# Patient Record
Sex: Male | Born: 1945 | Race: White | Hispanic: No | Marital: Married | Smoking: Former smoker
Health system: Southern US, Community
[De-identification: ages and names within clinical notes are randomized; demographics above are authoritative.]

## PROBLEM LIST (undated history)

## (undated) DIAGNOSIS — R413 Other amnesia: Secondary | ICD-10-CM

## (undated) DIAGNOSIS — M1990 Unspecified osteoarthritis, unspecified site: Secondary | ICD-10-CM

## (undated) DIAGNOSIS — E7800 Pure hypercholesterolemia, unspecified: Secondary | ICD-10-CM

## (undated) DIAGNOSIS — I10 Essential (primary) hypertension: Secondary | ICD-10-CM

## (undated) HISTORY — PX: BACK SURGERY: SHX140

## (undated) HISTORY — DX: Other amnesia: R41.3

---

## 2001-02-11 ENCOUNTER — Other Ambulatory Visit: Admission: RE | Admit: 2001-02-11 | Discharge: 2001-02-11 | Payer: Self-pay | Admitting: Urology

## 2001-02-27 ENCOUNTER — Other Ambulatory Visit: Admission: RE | Admit: 2001-02-27 | Discharge: 2001-02-27 | Payer: Self-pay | Admitting: Urology

## 2001-04-11 ENCOUNTER — Ambulatory Visit (HOSPITAL_COMMUNITY): Admission: RE | Admit: 2001-04-11 | Discharge: 2001-04-11 | Payer: Self-pay | Admitting: Internal Medicine

## 2001-09-17 ENCOUNTER — Ambulatory Visit (HOSPITAL_COMMUNITY): Admission: RE | Admit: 2001-09-17 | Discharge: 2001-09-17 | Payer: Self-pay | Admitting: Internal Medicine

## 2001-09-17 ENCOUNTER — Encounter: Payer: Self-pay | Admitting: Internal Medicine

## 2001-10-04 ENCOUNTER — Inpatient Hospital Stay (HOSPITAL_COMMUNITY): Admission: RE | Admit: 2001-10-04 | Discharge: 2001-10-05 | Payer: Self-pay | Admitting: Neurosurgery

## 2006-03-07 ENCOUNTER — Ambulatory Visit (HOSPITAL_COMMUNITY): Admission: RE | Admit: 2006-03-07 | Discharge: 2006-03-07 | Payer: Self-pay | Admitting: Internal Medicine

## 2012-07-05 ENCOUNTER — Encounter (HOSPITAL_COMMUNITY): Payer: Self-pay | Admitting: Emergency Medicine

## 2012-07-05 ENCOUNTER — Emergency Department (HOSPITAL_COMMUNITY)
Admission: EM | Admit: 2012-07-05 | Discharge: 2012-07-05 | Disposition: A | Discharge status: Home / Self Care | Payer: BC Managed Care – PPO | Attending: Emergency Medicine | Admitting: Emergency Medicine

## 2012-07-05 ENCOUNTER — Emergency Department (HOSPITAL_COMMUNITY): Payer: BC Managed Care – PPO

## 2012-07-05 DIAGNOSIS — E7800 Pure hypercholesterolemia, unspecified: Secondary | ICD-10-CM | POA: Insufficient documentation

## 2012-07-05 DIAGNOSIS — S82409A Unspecified fracture of shaft of unspecified fibula, initial encounter for closed fracture: Secondary | ICD-10-CM | POA: Insufficient documentation

## 2012-07-05 DIAGNOSIS — I10 Essential (primary) hypertension: Secondary | ICD-10-CM | POA: Insufficient documentation

## 2012-07-05 DIAGNOSIS — W1789XA Other fall from one level to another, initial encounter: Secondary | ICD-10-CM | POA: Insufficient documentation

## 2012-07-05 HISTORY — DX: Essential (primary) hypertension: I10

## 2012-07-05 HISTORY — DX: Pure hypercholesterolemia, unspecified: E78.00

## 2012-07-05 MED ORDER — MORPHINE SULFATE 4 MG/ML IJ SOLN
4.0000 mg | Freq: Once | INTRAMUSCULAR | Status: AC
Start: 2012-07-05 — End: 2012-07-05

## 2012-07-05 MED ORDER — MORPHINE BOLUS VIA INFUSION: 4.0000 mg | Freq: Once | INTRAVENOUS | Status: DC

## 2012-07-05 MED ORDER — MORPHINE SULFATE 4 MG/ML IJ SOLN
4.0000 mg | Freq: Once | INTRAMUSCULAR | Status: AC
  Administered 2012-07-05: 4 mg via INTRAVENOUS

## 2012-07-05 MED ORDER — MORPHINE SULFATE 4 MG/ML IJ SOLN
INTRAMUSCULAR | Status: AC
  Filled 2012-07-05: qty 1

## 2012-07-05 NOTE — ED Notes (Signed)
Dr Judd Lien and Dr Ouida Sills at bedside discussing plan of care with pt after test results completed.

## 2012-07-05 NOTE — ED Notes (Signed)
Dr. Hewitt(covering Dr. Leonides Grills) paged by his answering service for Dr. Ouida Sills.

## 2012-07-05 NOTE — ED Provider Notes (Signed)
History     CSN: [id number]  Arrival date & time 07/05/12  1750   First MD Initiated Contact with Patient 07/05/12 1802      Chief Complaint  Patient presents with  . Ankle Pain    (Consider location/radiation/quality/duration/timing/severity/associated sxs/prior treatment) HPI Comments: Patient was putting tree stand up when he lost his balance and fell about 15-18 feet, landing on both feet.  He is complaining of pain in both ankles, the right much greater than the left.  He denies other injury.  No back pain.  No loc.  No abd pain.  Patient is a 66 y.o. male presenting with ankle pain. The history is provided by the patient.  Ankle Pain  The incident occurred less than 1 hour ago. Incident location: [place] woods. The injury mechanism was a fall. The pain is present in the left ankle and right ankle. The quality of the pain is described as sharp and throbbing. The pain is moderate. The pain has been constant since onset. Associated symptoms include inability to bear weight. Pertinent negatives include no numbness.    Past Medical History  Diagnosis Date  . Hypertension   . High cholesterol     Past Surgical History  Procedure Date  . Back surgery     No family history on file.  History  Substance Use Topics  . Smoking status: Former Games developer  . Smokeless tobacco: Not on file  . Alcohol Use: Yes     social      Review of Systems  Neurological: Negative for numbness.  All other systems reviewed and are negative.    Allergies  Review of patient's allergies indicates no known allergies.  Home Medications  No current outpatient prescriptions on file.  BP 146/65  Pulse 62  Temp 98.3 F (36.8 C)  Resp 18  Ht 5\' 8"  (1.727 m)  Wt 180 lb (81.647 kg)  BMI 27.37 kg/m2  SpO2 99%  Physical Exam  Nursing note and vitals reviewed. Constitutional: He is oriented to person, place, and time. He appears well-developed and well-nourished. No distress.  HENT:  Head:  Normocephalic and atraumatic.  Neck: Normal range of motion. Neck supple.  Musculoskeletal: Normal range of motion.       There is no lumbar spine ttp.  There is swelling of the right ankle with ttp over the lateral malleolus.  The dorsalis pedis pulses are intact as is sensation and motor.  The left ankle has mild swelling over the lateral malleolus but no deformity.  Distal pulses, motor, and sensation are intact.  Neurological: He is alert and oriented to person, place, and time. No cranial nerve deficit.  Skin: Skin is warm and dry. He is not diaphoretic.    ED Course  Procedures (including critical care time)  Labs Reviewed - No data to display Dg Ankle Complete Left  07/05/2012  *RADIOLOGY REPORT*  Clinical Data: Fall from tree stand, bilateral ankle pain  LEFT ANKLE COMPLETE - 3+ VIEW  Comparison: None.  Findings: Possible tiny avulsion fracture along the inferior aspect of the medial malleolus.  The ankle mortise is intact.  The base of the fifth metatarsal is unremarkable.  Mild medial soft tissue swelling.  IMPRESSION: Possible tiny avulsion fracture along the inferior aspect of the medial malleolus.   Original Report Authenticated By: Charline Bills, M.D.    Dg Ankle Complete Right  07/05/2012  *RADIOLOGY REPORT*  Clinical Data: Fall from tree, bilateral ankle pain/swelling  RIGHT ANKLE - COMPLETE  3+ VIEW  Comparison: None.  Findings: Comminuted distal fibular fracture, mildly displaced.  Possible tiny avulsion fracture along the inferior aspect of the medial malleolus.  Mild widening of the medial ankle mortise.  The base of the fifth metatarsal is unremarkable.  Moderate soft tissue swelling.  IMPRESSION: Comminuted distal fibular fracture, mildly displaced.  Possible tiny avulsion fracture along the medial malleolus.   Original Report Authenticated By: Charline Bills, M.D.      No diagnosis found.    MDM  The xrays reveal a distal fibular fracture of the right ankle with  widening of the mortoise on the medial aspect.  The left ankle was read by radiology as a possible tiny avulsion fracture to the lateral malleolous.  The patient is a personal friend of Dr. Ouida Sills who was present in the ER.  He took the liberty of speaking with the orthopedist on call and arrangements have been made for splinting, follow up on Monday with Orthopedics.  He has also written him a prescription for pain medications.         Geoffery Lyons, MD 07/05/12 [id number]

## 2012-07-05 NOTE — ED Notes (Signed)
Pt fell approximately 18 ft from tree stand onto feet/back. Denies loc. Denies neck/back pain. Dr.Fagan at bedside.

## 2012-07-05 NOTE — ED Notes (Signed)
Pt presents with Dr Ouida Sills at bedside after a substantial fall from a tree stand. Rt ankle swollen with deformity noted. Pt denies LOC, Neck/back and head injury during fall. Pt is alert, oriented x 4. No distress noted.

## 2012-07-05 NOTE — ED Notes (Signed)
Pt assisted to wheelchair and in the car by Gaspar Garbe, NT and Tammy Sours, RN

## 2012-07-08 ENCOUNTER — Encounter (HOSPITAL_BASED_OUTPATIENT_CLINIC_OR_DEPARTMENT_OTHER): Payer: Self-pay | Admitting: *Deleted

## 2012-07-08 NOTE — Progress Notes (Signed)
Pt fell from deer stand-fx rt ankle-severly sprained lt ankle-cannot walk-using w/c-wife states it is very hard to get him out-she has to call brother to help her. Will do istat and elk on arrival

## 2012-07-11 ENCOUNTER — Ambulatory Visit (HOSPITAL_BASED_OUTPATIENT_CLINIC_OR_DEPARTMENT_OTHER): Payer: BC Managed Care – PPO | Admitting: Anesthesiology

## 2012-07-11 ENCOUNTER — Encounter (HOSPITAL_BASED_OUTPATIENT_CLINIC_OR_DEPARTMENT_OTHER): Payer: Self-pay | Admitting: Anesthesiology

## 2012-07-11 ENCOUNTER — Ambulatory Visit (HOSPITAL_COMMUNITY): Payer: BC Managed Care – PPO

## 2012-07-11 ENCOUNTER — Encounter (HOSPITAL_BASED_OUTPATIENT_CLINIC_OR_DEPARTMENT_OTHER): Payer: Self-pay

## 2012-07-11 ENCOUNTER — Encounter (HOSPITAL_BASED_OUTPATIENT_CLINIC_OR_DEPARTMENT_OTHER)
Admission: RE | Disposition: A | Discharge status: Home / Self Care | Payer: Self-pay | Source: Ambulatory Visit | Attending: Orthopedic Surgery

## 2012-07-11 ENCOUNTER — Ambulatory Visit (HOSPITAL_BASED_OUTPATIENT_CLINIC_OR_DEPARTMENT_OTHER)
Admission: RE | Admit: 2012-07-11 | Discharge: 2012-07-11 | Disposition: A | Discharge status: Home / Self Care | Payer: BC Managed Care – PPO | Source: Ambulatory Visit | Attending: Orthopedic Surgery | Admitting: Orthopedic Surgery

## 2012-07-11 DIAGNOSIS — S8263XA Displaced fracture of lateral malleolus of unspecified fibula, initial encounter for closed fracture: Secondary | ICD-10-CM | POA: Insufficient documentation

## 2012-07-11 DIAGNOSIS — W1789XA Other fall from one level to another, initial encounter: Secondary | ICD-10-CM | POA: Insufficient documentation

## 2012-07-11 DIAGNOSIS — Y92009 Unspecified place in unspecified non-institutional (private) residence as the place of occurrence of the external cause: Secondary | ICD-10-CM | POA: Insufficient documentation

## 2012-07-11 DIAGNOSIS — S93439A Sprain of tibiofibular ligament of unspecified ankle, initial encounter: Secondary | ICD-10-CM | POA: Insufficient documentation

## 2012-07-11 DIAGNOSIS — I10 Essential (primary) hypertension: Secondary | ICD-10-CM | POA: Insufficient documentation

## 2012-07-11 HISTORY — PX: ORIF ANKLE FRACTURE: SHX5408

## 2012-07-11 HISTORY — DX: Unspecified osteoarthritis, unspecified site: M19.90

## 2012-07-11 LAB — POCT I-STAT, CHEM 8
Calcium, Ion: 1.19 mmol/L (ref 1.13–1.30)
Glucose, Bld: 106 mg/dL — ABNORMAL HIGH (ref 70–99)
HCT: 40 % (ref 39.0–52.0)
Hemoglobin: 13.6 g/dL (ref 13.0–17.0)
TCO2: 24 mmol/L (ref 0–100)

## 2012-07-11 SURGERY — OPEN REDUCTION INTERNAL FIXATION (ORIF) ANKLE FRACTURE
Anesthesia: Regional | Site: Ankle | Laterality: Right | Wound class: Clean

## 2012-07-11 MED ORDER — BACITRACIN ZINC 500 UNIT/GM EX OINT
TOPICAL_OINTMENT | CUTANEOUS | Status: DC | PRN
  Administered 2012-07-11: 1 via TOPICAL

## 2012-07-11 MED ORDER — BUPIVACAINE-EPINEPHRINE PF 0.5-1:200000 % IJ SOLN
INTRAMUSCULAR | Status: DC | PRN
  Administered 2012-07-11: 30 mL

## 2012-07-11 MED ORDER — CHLORHEXIDINE GLUCONATE 4 % EX LIQD: 60.0000 mL | Freq: Once | CUTANEOUS | Status: DC

## 2012-07-11 MED ORDER — CEFAZOLIN SODIUM-DEXTROSE 2-3 GM-% IV SOLR
2.0000 g | INTRAVENOUS | Status: AC
  Administered 2012-07-11: 2 g via INTRAVENOUS

## 2012-07-11 MED ORDER — FENTANYL CITRATE 0.05 MG/ML IJ SOLN
50.0000 ug | INTRAMUSCULAR | Status: DC | PRN
  Administered 2012-07-11: 100 ug via INTRAVENOUS

## 2012-07-11 MED ORDER — ONDANSETRON HCL 4 MG/2ML IJ SOLN: 4.0000 mg | Freq: Four times a day (QID) | INTRAMUSCULAR | Status: DC | PRN

## 2012-07-11 MED ORDER — CEFAZOLIN SODIUM-DEXTROSE 2-3 GM-% IV SOLR: 2.0000 g | INTRAVENOUS | Status: DC

## 2012-07-11 MED ORDER — HYDROMORPHONE HCL PF 1 MG/ML IJ SOLN: 0.2500 mg | INTRAMUSCULAR | Status: DC | PRN

## 2012-07-11 MED ORDER — MIDAZOLAM HCL 5 MG/5ML IJ SOLN
INTRAMUSCULAR | Status: DC | PRN
  Administered 2012-07-11: 1 mg via INTRAVENOUS

## 2012-07-11 MED ORDER — OXYCODONE HCL 5 MG PO TABS: 5.0000 mg | ORAL_TABLET | ORAL | Status: DC | PRN

## 2012-07-11 MED ORDER — ONDANSETRON HCL 4 MG/2ML IJ SOLN
INTRAMUSCULAR | Status: DC | PRN
  Administered 2012-07-11: 4 mg via INTRAVENOUS

## 2012-07-11 MED ORDER — 0.9 % SODIUM CHLORIDE (POUR BTL) OPTIME
TOPICAL | Status: DC | PRN
  Administered 2012-07-11: 300 mL

## 2012-07-11 MED ORDER — DEXAMETHASONE SODIUM PHOSPHATE 10 MG/ML IJ SOLN
INTRAMUSCULAR | Status: DC | PRN
  Administered 2012-07-11: 10 mg via INTRAVENOUS

## 2012-07-11 MED ORDER — PROPOFOL 10 MG/ML IV BOLUS
INTRAVENOUS | Status: DC | PRN
  Administered 2012-07-11: 200 mg via INTRAVENOUS

## 2012-07-11 MED ORDER — LACTATED RINGERS IV SOLN
INTRAVENOUS | Status: DC
  Administered 2012-07-11 (×2): via INTRAVENOUS

## 2012-07-11 MED ORDER — LIDOCAINE HCL (CARDIAC) 20 MG/ML IV SOLN
INTRAVENOUS | Status: DC | PRN
  Administered 2012-07-11: 30 mg via INTRAVENOUS

## 2012-07-11 MED ORDER — MIDAZOLAM HCL 2 MG/2ML IJ SOLN
1.0000 mg | INTRAMUSCULAR | Status: DC | PRN
  Administered 2012-07-11: 2 mg via INTRAVENOUS

## 2012-07-11 MED ORDER — SODIUM CHLORIDE 0.9 % IV SOLN: INTRAVENOUS | Status: DC

## 2012-07-11 SURGICAL SUPPLY — 74 items
BAG DECANTER FOR FLEXI CONT (MISCELLANEOUS) IMPLANT
BANDAGE ESMARK 6X9 LF (GAUZE/BANDAGES/DRESSINGS) ×1 IMPLANT
BIT DRILL 2.5X2.75 QC CALB (BIT) ×1 IMPLANT
BIT DRILL 3.5X5.5 QC CALB (BIT) ×1 IMPLANT
BIT DRILL CALIBRATED 2.7 (BIT) ×1 IMPLANT
BLADE SURG 15 STRL LF DISP TIS (BLADE) ×3 IMPLANT
BLADE SURG 15 STRL SS (BLADE) ×4
BNDG CMPR 9X6 STRL LF SNTH (GAUZE/BANDAGES/DRESSINGS) ×1
BNDG COHESIVE 4X5 TAN STRL (GAUZE/BANDAGES/DRESSINGS) ×3 IMPLANT
BNDG COHESIVE 6X5 TAN STRL LF (GAUZE/BANDAGES/DRESSINGS) ×3 IMPLANT
BNDG ESMARK 6X9 LF (GAUZE/BANDAGES/DRESSINGS) ×2
CHLORAPREP W/TINT 26ML (MISCELLANEOUS) ×2 IMPLANT
CLOTH BEACON ORANGE TIMEOUT ST (SAFETY) ×2 IMPLANT
COVER MAYO STAND STRL (DRAPES) ×1 IMPLANT
COVER TABLE BACK 60X90 (DRAPES) ×2 IMPLANT
CUFF TOURNIQUET SINGLE 34IN LL (TOURNIQUET CUFF) ×2 IMPLANT
DECANTER SPIKE VIAL GLASS SM (MISCELLANEOUS) IMPLANT
DRAPE C-ARM 42X72 X-RAY (DRAPES) ×2 IMPLANT
DRAPE EXTREMITY T 121X128X90 (DRAPE) ×2 IMPLANT
DRAPE INCISE IOBAN 66X45 STRL (DRAPES) ×2 IMPLANT
DRAPE U-SHAPE 47X51 STRL (DRAPES) ×2 IMPLANT
DRAPE U-SHAPE 76X120 STRL (DRAPES) ×2 IMPLANT
DRESSING ADAPTIC 1/2  N-ADH (PACKING) IMPLANT
DRSG EMULSION OIL 3X3 NADH (GAUZE/BANDAGES/DRESSINGS) ×4 IMPLANT
DRSG PAD ABDOMINAL 8X10 ST (GAUZE/BANDAGES/DRESSINGS) ×4 IMPLANT
ELECT REM PT RETURN 9FT ADLT (ELECTROSURGICAL) ×2
ELECTRODE REM PT RTRN 9FT ADLT (ELECTROSURGICAL) ×1 IMPLANT
GLOVE BIO SURGEON STRL SZ8 (GLOVE) ×3 IMPLANT
GLOVE BIOGEL PI IND STRL 8 (GLOVE) ×1 IMPLANT
GLOVE BIOGEL PI INDICATOR 8 (GLOVE) ×1
GLOVE ECLIPSE 6.5 STRL STRAW (GLOVE) ×1 IMPLANT
GOWN PREVENTION PLUS XLARGE (GOWN DISPOSABLE) ×2 IMPLANT
GOWN PREVENTION PLUS XXLARGE (GOWN DISPOSABLE) ×2 IMPLANT
K-WIRE ACE 1.6X6 (WIRE) ×4
KWIRE ACE 1.6X6 (WIRE) IMPLANT
NEEDLE HYPO 22GX1.5 SAFETY (NEEDLE) IMPLANT
PACK BASIN DAY SURGERY FS (CUSTOM PROCEDURE TRAY) ×2 IMPLANT
PAD CAST 4YDX4 CTTN HI CHSV (CAST SUPPLIES) ×2 IMPLANT
PADDING CAST COTTON 4X4 STRL (CAST SUPPLIES) ×4
PADDING CAST COTTON 6X4 STRL (CAST SUPPLIES) ×2 IMPLANT
PENCIL BUTTON HOLSTER BLD 10FT (ELECTRODE) ×2 IMPLANT
PLATE FIBULAR COMP LOCK 10H (Plate) ×1 IMPLANT
SCREW CORT T15 24X3.5XST LCK (Screw) IMPLANT
SCREW CORTICAL 3.5MM  20MM (Screw) ×1 IMPLANT
SCREW CORTICAL 3.5MM 20MM (Screw) ×1 IMPLANT
SCREW CORTICAL 3.5X24MM (Screw) ×2 IMPLANT
SCREW CORTICAL LOW PROF 3.5X20 (Screw) ×1 IMPLANT
SCREW LOCK CANC STAR 4X10 (Screw) ×1 IMPLANT
SCREW LOCK CANC STAR 4X14 (Screw) ×1 IMPLANT
SCREW LOW PROFILE 18MMX3.5MM (Screw) ×1 IMPLANT
SCREW NON LOCKING LP 3.5 16MM (Screw) ×4 IMPLANT
SHEET MEDIUM DRAPE 40X70 STRL (DRAPES) ×3 IMPLANT
SLEEVE SCD COMPRESS KNEE MED (MISCELLANEOUS) ×1 IMPLANT
SPLINT FAST PLASTER 5X30 (CAST SUPPLIES) ×20
SPLINT PLASTER CAST FAST 5X30 (CAST SUPPLIES) ×20 IMPLANT
SPONGE GAUZE 4X4 12PLY (GAUZE/BANDAGES/DRESSINGS) ×2 IMPLANT
SPONGE LAP 18X18 X RAY DECT (DISPOSABLE) ×2 IMPLANT
STAPLER VISISTAT 35W (STAPLE) IMPLANT
STOCKINETTE 6  STRL (DRAPES) ×1
STOCKINETTE 6 STRL (DRAPES) ×1 IMPLANT
STRIP CLOSURE SKIN 1/2X4 (GAUZE/BANDAGES/DRESSINGS) IMPLANT
SUCTION FRAZIER TIP 10 FR DISP (SUCTIONS) ×2 IMPLANT
SUT ETHILON 3 0 PS 1 (SUTURE) ×2 IMPLANT
SUT MNCRL AB 3-0 PS2 18 (SUTURE) ×3 IMPLANT
SUT PROLENE 3 0 PS 2 (SUTURE) ×2 IMPLANT
SUT VIC AB 0 SH 27 (SUTURE) IMPLANT
SUT VIC AB 2-0 SH 27 (SUTURE) ×2
SUT VIC AB 2-0 SH 27XBRD (SUTURE) IMPLANT
SUT VICRYL 4-0 PS2 18IN ABS (SUTURE) IMPLANT
SYR BULB 3OZ (MISCELLANEOUS) ×2 IMPLANT
SYR CONTROL 10ML LL (SYRINGE) IMPLANT
TUBE CONNECTING 20X1/4 (TUBING) ×2 IMPLANT
UNDERPAD 30X30 INCONTINENT (UNDERPADS AND DIAPERS) ×2 IMPLANT
WATER STERILE IRR 1000ML POUR (IV SOLUTION) IMPLANT

## 2012-07-11 NOTE — Anesthesia Preprocedure Evaluation (Signed)
Anesthesia Evaluation  Patient identified by MRN, date of birth, ID band Patient awake    Reviewed: Allergy & Precautions, H&P , NPO status , Patient's Chart, lab work & pertinent test results  Airway Mallampati: II  Neck ROM: full    Dental   Pulmonary former smoker,          Cardiovascular hypertension,     Neuro/Psych    GI/Hepatic   Endo/Other    Renal/GU      Musculoskeletal  (+) Arthritis -,   Abdominal   Peds  Hematology   Anesthesia Other Findings   Reproductive/Obstetrics                           Anesthesia Physical Anesthesia Plan  ASA: II  Anesthesia Plan: General and Regional   Post-op Pain Management: MAC Combined w/ Regional for Post-op pain   Induction: Intravenous  Airway Management Planned: LMA  Additional Equipment:   Intra-op Plan:   Post-operative Plan:   Informed Consent: I have reviewed the patients History and Physical, chart, labs and discussed the procedure including the risks, benefits and alternatives for the proposed anesthesia with the patient or authorized representative who has indicated his/her understanding and acceptance.     Plan Discussed with: CRNA and Surgeon  Anesthesia Plan Comments:         Anesthesia Quick Evaluation

## 2012-07-11 NOTE — Anesthesia Procedure Notes (Addendum)
Procedure Name: LMA Insertion Performed by: BLOCKER, TIMOTHY D Pre-anesthesia Checklist: Patient identified, Emergency Drugs available, Suction available and Patient being monitored Patient Re-evaluated:Patient Re-evaluated prior to inductionOxygen Delivery Method: Circle System Utilized Preoxygenation: Pre-oxygenation with 100% oxygen Intubation Type: IV induction Ventilation: Mask ventilation without difficulty LMA: LMA inserted LMA Size: 5.0 Number of attempts: 1 Placement Confirmation: positive ETCO2 Tube secured with: Tape Dental Injury: Teeth and Oropharynx as per pre-operative assessment    Anesthesia Regional Block:  Popliteal block  Pre-Anesthetic Checklist: ,, timeout performed, Correct Patient, Correct Site, Correct Laterality, Correct Procedure, Correct Position, site marked, Risks and benefits discussed,  Surgical consent,  Pre-op evaluation,  At surgeon's request and post-op pain management  Laterality: Right  Prep: chloraprep       Needles:  Injection technique: Single-shot  Needle Type: Echogenic Stimulator Needle     Needle Length:cm 9 cm Needle Gauge: 21 G    Additional Needles:  Procedures: ultrasound guided and nerve stimulator Popliteal block  Nerve Stimulator or Paresthesia:  Response: plantar flexion of foot, 0.45 mA,   Additional Responses:   Narrative:  Start time: 07/11/2012 9:30 AM End time: 07/11/2012 9:46 AM Injection made incrementally with aspirations every 5 mL.  Performed by: Personally  Anesthesiologist: Dr Chaney Malling  Additional Notes: Functioning IV was confirmed and monitors were applied.  A 90mm 21ga Arrow echogenic stimulator needle was used. Sterile prep and drape,hand hygiene and sterile gloves were used.  Negative aspiration and negative test dose prior to incremental administration of local anesthetic. The patient tolerated the procedure well.  Ultrasound guidance: relevent anatomy identified, needle position confirmed, local  anesthetic spread visualized around nerve(s), vascular puncture avoided.  Image printed for medical record.   Popliteal block

## 2012-07-11 NOTE — H&P (Signed)
Steven Gibbs is an 66 y.o. male.   Chief Complaint: right ankle injury HPI: 66 y/o male without significant PMH fell from a tree stand last week injuring his right ankle.  He was seen in the ER at [facility] and was found to have a fracture of the lateral malleolus and disruption of the syndesmosis.  He presents now for ORIF.  Past Medical History  Diagnosis Date  . Hypertension   . High cholesterol   . Arthritis     Past Surgical History  Procedure Date  . Back surgery     No family history on file. Social History:  reports that he quit smoking about 30 years ago. He does not have any smokeless tobacco history on file. He reports that he drinks alcohol. He reports that he does not use illicit drugs.  Allergies: No Known Allergies  No prescriptions prior to admission    No results found for this or any previous visit (from the past 48 hour(s)). No results found.  ROS  No recent f/cn//v/wt loss.  Height 5\' 8"  (1.727 m), weight 81.647 kg (180 lb). Physical Exam  wn wd male in nad.  A and O x 4.  Mood and affect normal.  EOMI.  Respirations unlabored.  R ankle with splint in place.  Wiggle stoes.  Feels LT at toes dorsally and plantarly.  Assessment/Plan Right ankle lateral malleolus fracture, syndesmosis disruption - to OR for ORIF and possible repair of the deltoid ligament.  The risks and benefits of the alternative treatment options have been discussed in detail.  The patient wishes to proceed with surgery and specifically understands risks of bleeding, infection, nerve damage, blood clots, need for additional surgery, amputation and death.   Steven Gibbs, Steven Gibbs Aug 06, 2012, 7:20 AM

## 2012-07-11 NOTE — Anesthesia Postprocedure Evaluation (Signed)
Anesthesia Post Note  Patient: Steven Gibbs  Procedure(s) Performed: Procedure(s) (LRB): OPEN REDUCTION INTERNAL FIXATION (ORIF) ANKLE FRACTURE (Right)  Anesthesia type: General  Patient location: PACU  Post pain: Pain level controlled and Adequate analgesia  Post assessment: Post-op Vital signs reviewed, Patient's Cardiovascular Status Stable, Respiratory Function Stable, Patent Airway and Pain level controlled  Last Vitals:  Filed Vitals:   07/11/12 1200  BP: 129/73  Pulse:   Temp:   Resp:     Post vital signs: Reviewed and stable  Level of consciousness: awake, alert  and oriented  Complications: No apparent anesthesia complications

## 2012-07-11 NOTE — Transfer of Care (Signed)
Immediate Anesthesia Transfer of Care Note  Patient: Steven Gibbs  Procedure(s) Performed: Procedure(s) (LRB) with comments: OPEN REDUCTION INTERNAL FIXATION (ORIF) ANKLE FRACTURE (Right) - Lateral malleous /  syndesmosis disruption   Patient Location: PACU  Anesthesia Type: General and Regional  Level of Consciousness: awake, alert , oriented and patient cooperative  Airway & Oxygen Therapy: Patient Spontanous Breathing and Patient connected to face mask oxygen  Post-op Assessment: Report given to PACU RN and Post -op Vital signs reviewed and stable  Post vital signs: Reviewed and stable  Complications: No apparent anesthesia complications

## 2012-07-11 NOTE — Progress Notes (Signed)
Assisted Dr. Hodierne with right, ultrasound guided, popliteal block. Side rails up, monitors on throughout procedure. See vital signs in flow sheet. Tolerated Procedure well. 

## 2012-07-11 NOTE — Brief Op Note (Signed)
07/11/2012  11:46 AM  PATIENT:  Steven Gibbs  66 y.o. male  PRE-OPERATIVE DIAGNOSIS:  Right ankle lateral malleous fracture and syndesmosis disruption   POST-OPERATIVE DIAGNOSIS:  Right ankle lateral malleous fracture with stable syndesmosis  Procedure(s): 1. OPEN REDUCTION INTERNAL FIXATION right ankle lateral malleolus fracture 2.  Stress exam of right ankle under fluoro 3.  fluoro  SURGEON:  Toni Arthurs, MD  ASSISTANT: n/a  ANESTHESIA:   General, regional  EBL:  minimal   TOURNIQUET:   Total Tourniquet Time Documented: Thigh (Right) - 67 minutes  COMPLICATIONS:  None apparent  DISPOSITION:  Extubated, awake and stable to recovery.  DICTATION ID:  [id number]

## 2012-07-12 NOTE — Op Note (Signed)
NAME:  Steven Gibbs, Steven Gibbs NO.:  [id number]  MEDICAL RECORD NO.:  [id number]  LOCATION:                                 FACILITY:  PHYSICIAN:  Toni Arthurs, MD        DATE OF BIRTH:  13-Jan-1946  DATE OF PROCEDURE:  07/11/2012 DATE OF DISCHARGE:                              OPERATIVE REPORT   PREOPERATIVE DIAGNOSES: 1. Right ankle lateral malleolus fracture. 2. Right ankle syndesmosis disruption.  POSTOPERATIVE DIAGNOSES: 1. Right ankle lateral malleolus fracture. 2. Stable syndesmosis of the right ankle.  PROCEDURES: 1. Open reduction and internal fixation of right ankle lateral     malleolus fracture. 2. Stress examination of right ankle under fluoroscopy. 3. Intraoperative interpretation of fluoroscopic imaging.  SURGEON:  Toni Arthurs, MD.  ANESTHESIA:  General, regional.  ESTIMATED BLOOD LOSS:  Minimal.  TOURNIQUET TIME:  67 minutes at 225 mmHg.  COMPLICATIONS:  None apparent.  DISPOSITION:  Extubated, awake, and stable to recovery.  INDICATIONS FOR PROCEDURE:  The patient is a 66 year old male who fell from a tree stand landing on both feet.  He suffered a left ankle sprain and a right ankle lateral malleolus fracture.  Preoperative x-rays indicate widening of the syndesmosis and medial clear space as well as a comminuted fracture of the lateral malleolus.  He presents now for operative treatment of this injury.  He understands the risks and benefits of the alternative treatment options and elects surgical treatment.  He specifically understands risks of bleeding, infection, nerve damage, blood clots, need for additional surgery, ankle arthritis, amputation, and death.  PROCEDURE IN DETAIL:  After preoperative consent was obtained and the correct operative site was identified, the patient was brought to the operating room and placed supine on the operating table.  General anesthesia was induced.  Preoperative antibiotics were  administered. Surgical time-out was taken.  The right lower extremity was prepped and draped in standard sterile fashion with a tourniquet around the thigh. The extremity was exsanguinated, and the tourniquet was inflated to 225 mmHg.  A longitudinal incision was made over the lateral malleolus. Sharp dissection was carried down through the skin and subcutaneous tissue taking care to protect the branches of the superficial peroneal nerve.  Fracture site was identified.  The fracture was exposed proximally and distally.  The proximal aspect of the fracture was reduced with a tenaculum.  AP and lateral fluoroscopic images showed appropriate reduction.  A combination of locking one-third tubular plate was selected and applied to the lateral aspect of the fibula.  It was secured with 2 K-wires and a clamp holding the fracture site compressed. A 3.5 mm fully-threaded screw was then inserted through the oblong hole in the proximal end of the plate in lag fashion across the proximal fracture line.  This secured the proximal fracture as well as securing the plate to the lateral fibula.  Distally, a nonlocking screw was inserted pulling the plate down to the lateral fibula.  Two more locking screws were inserted into the distal fibula in unicortical fashion. This bridged the comminuted segment appropriately.  A 3.5-mm screw was then inserted in lag fashion across the distal fracture  line compressing the fracture appropriately.  Proximally 2 more nonlocking 3.5 mm fully- threaded screws were inserted.  AP and lateral views showed appropriate position and length of all hardware and appropriate reduction of the fracture sites.  At this point, a mortise view was obtained.  Under live fluoroscopy, dorsiflexion and external rotation stress was applied.  There was no widening of the syndesmosis or medial clear space noted.  Lateral wound was then irrigated copiously.  Inverted simple sutures of 2-0  Vicryl were used to close the deep subcutaneous tissues over the plate.  The superficial subcutaneous tissues were closed with inverted simple sutures of 3-0 Monocryl and a running 3-0 nylon suture was used to close the skin incision.  Sterile dressings were applied followed by a well- padded short-leg splint.  Tourniquet was released at 1 hour and 7 minutes after application of the dressings.  The patient was then awakened by Anesthesia and transported to recovery room in stable condition.  FOLLOWUP PLAN:  The patient will be nonweightbearing on his right lower extremity.  He will be sent home with pain medicines and an aspirin a day for DVT prophylaxis.  He will follow up with me in 2 weeks for suture removal and conversion to a cast.     Toni Arthurs, MD     JH/MEDQ  D:  07/11/2012  T:  07/12/2012  Job:  [id number]

## 2012-07-15 NOTE — Progress Notes (Signed)
NAMEMarland Kitchen  Steven Gibbs, Steven Gibbs NO.:  [id number]  MEDICAL RECORD NO.:  [id number]  LOCATION:                                 FACILITY:  PHYSICIAN:  Kingsley Callander. Ouida Sills, MD       DATE OF BIRTH:  07/03/1946  DATE OF PROCEDURE:  07/05/2012 DATE OF DISCHARGE:  07/05/2012                                PROGRESS NOTE   Steven Gibbs presented to the emergency room by car after falling from a tree stand.  He fell approximately 18 feet.  He fell on to both feet and has pain in both ankles and lower legs.  He denied any head injury.  There was no loss of consciousness.  He denies any neck pain.  PAST MEDICAL HISTORY:  Hypertension and hyperlipidemia treated with metoprolol, diltiazem, atorvastatin, fenofibrate, and aspirin.  PHYSICAL EXAMINATION:  GENERAL:  He was alert and fully oriented. HEENT:  Unremarkable. NECK:  Supple.  Spine is nontender. LUNGS:  Clear. HEART:  Regular with no murmurs. ABDOMEN:  Soft and nontender. EXTREMITIES:  Revealed an unstable right ankle joint with swelling. Skin is intact.  Distal pulses are intact.  He has bruising and swelling also of the left ankle, but with good range of motion, and normal pulses in the left foot.  X-rays revealed a comminuted right distal fibular fracture.  There is a question of a tiny avulsion fracture medially.  The left ankle reveals no definite fracture, but again a questionable small avulsion is noted.  IMPRESSIONS/PLAN:  Right distal fibula fracture.  His case has been discussed with the foot and ankle orthopedic specialist in [place], Dr. Victorino Dike.  A posterior splint is being placed and followup is being arranged with Dr. Victorino Dike for re-evaluation in 3 days.  He will continue ice and elevation.  He was given a prescription for Percocet 5/325 q.4 p.r.n. No weightbearing advised.     Kingsley Callander. Ouida Sills, MD     ROF/MEDQ  D:  07/12/2012  T:  07/12/2012  Job:  [id number]

## 2012-07-18 ENCOUNTER — Encounter (HOSPITAL_BASED_OUTPATIENT_CLINIC_OR_DEPARTMENT_OTHER): Payer: Self-pay | Admitting: Orthopedic Surgery

## 2012-09-05 ENCOUNTER — Ambulatory Visit (HOSPITAL_COMMUNITY)
Admission: RE | Admit: 2012-09-05 | Discharge: 2012-09-05 | Disposition: A | Discharge status: Home / Self Care | Payer: BC Managed Care – PPO | Source: Ambulatory Visit | Attending: Orthopedic Surgery | Admitting: Orthopedic Surgery

## 2012-09-05 DIAGNOSIS — R262 Difficulty in walking, not elsewhere classified: Secondary | ICD-10-CM | POA: Insufficient documentation

## 2012-09-05 DIAGNOSIS — M25579 Pain in unspecified ankle and joints of unspecified foot: Secondary | ICD-10-CM | POA: Insufficient documentation

## 2012-09-05 DIAGNOSIS — S8261XA Displaced fracture of lateral malleolus of right fibula, initial encounter for closed fracture: Secondary | ICD-10-CM

## 2012-09-05 DIAGNOSIS — S93402A Sprain of unspecified ligament of left ankle, initial encounter: Secondary | ICD-10-CM | POA: Insufficient documentation

## 2012-09-05 DIAGNOSIS — IMO0001 Reserved for inherently not codable concepts without codable children: Secondary | ICD-10-CM | POA: Insufficient documentation

## 2012-09-05 HISTORY — DX: Displaced fracture of lateral malleolus of right fibula, initial encounter for closed fracture: S82.61XA

## 2012-09-05 HISTORY — DX: Difficulty in walking, not elsewhere classified: R26.2

## 2012-09-05 NOTE — Evaluation (Signed)
Physical Therapy Evaluation  Patient Details  Name: Steven Gibbs MRN: [id number] Date of Birth: 21-Oct-1945  Today's Date: 09/05/2012 Time: 0950-1020 PT Time Calculation (min): 30 min Charges: 1 eval, 15' TE Visit#: 1  of 8   Re-eval: 10/05/12  Diagnosis: r ankle ORIF and L ankle sprain Surgical Date: 07/11/12 Next MD Visit: Dr. Victorino Dike - Dec 1  Past Medical History:  Past Medical History  Diagnosis Date  . Hypertension   . High cholesterol   . Arthritis    Past Surgical History:  Past Surgical History  Procedure Date  . Back surgery   . Orif ankle fracture 07/11/2012    Procedure: OPEN REDUCTION INTERNAL FIXATION (ORIF) ANKLE FRACTURE;  Surgeon: Toni Arthurs, MD;  Location: [facility];  Service: Orthopedics;  Laterality: Right;  Lateral malleous /  syndesmosis disruption    Subjective Symptoms/Limitations Symptoms: PMH: controlled HTN Pertinent History: Pt is referred to PT for R ankle fracture.  He reports that on Sept 20th he was putting up deer stands and feel down and landed on both of his feet and jammed his legs.  He ended up with a bad sprain to his L ankle and had a broken fibula to his R ankle. He had an ORIF on his R lateral malleousl on 07/11/12.  He was in a soft cast for 2 weeks, hard cast for 4 weeks and a CAM boot for 2 weeks.  He has a ALSO on his L ankle. Currently he is ambulating with his bilateral axillary crutches.  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  He enjoys planting around his [place].   Cognition/Observation Observation/Other Assessments Observations:  increased swelling to R ankle.   Other Assessments: sigificant impairment to B hamstring flexibility  Sensation/Coordination/Flexibility/Functional Tests Sensation Additional Comments: numbness to dorsal side of calcaneous Functional Tests Functional Tests: LEFS: 22/80  RLE AROM (degrees) RLE Overall AROM Comments: Soleus: 2 degrees, Gastroc Right Ankle Dorsiflexion: 2  Right Ankle Plantar Flexion: 32  Right Ankle Inversion: 0  Right Ankle Eversion: 0   RLE Strength Right Hip Flexion: 4/5 Right Hip Extension: 4/5 Right Hip ABduction: 4/5 Right Ankle Dorsiflexion: 4/5 Right Ankle Plantar Flexion: 4/5 Right Ankle Inversion: 3+/5 Right Ankle Eversion: 3+/5  LLE AROM (degrees) LLE Overall AROM Comments: Solues: 10 Gastroc: 10 Left Ankle Plantar Flexion: 52  Left Ankle Inversion: 32  Left Ankle Eversion: 18   LLE Strength Left Hip Flexion: 4/5 Left Hip Extension: 4/5 Left Hip ABduction: 4/5 Left Hip ADduction: 4/5 Left Ankle Dorsiflexion: 3+/5 Left Ankle Plantar Flexion: 3+/5 Left Ankle Inversion: 3+/5 Left Ankle Eversion: 3+/5  Palpation: mild pain and tenderness to R ankle over inscision, L ankle pain and tenderness to ATFL.  Exercise/Treatments Ankle Exercises - Seated Towel Crunch: 1 rep Towel Inversion/Eversion: 5 reps (R and L) Heel Raises: 10 reps Toe Raise: 10 reps Ankle Exercises - Supine T-Band: L: DF, PF, INV, and EV; R: DF and PF  Physical Therapy Assessment and Plan PT Assessment and Plan Clinical Impression Statement: Pt is a 66 year old male referred to PT secondary to R ankle ORIF and L ankle sprain secondary to falling out of a tree  stand on 07/05/12.  He is currently WBAT in his CAM boot.  Pt will benefit from skilled therapeutic intervention in order to improve on the following deficits: Abnormal gait;Decreased activity tolerance;Decreased balance;Difficulty walking;Decreased strength;Decreased range of motion;Decreased mobility;Pain;Impaired  flexibility Rehab Potential: Good PT Frequency: Min 2X/week PT Duration: 8 weeks PT Treatment/Interventions: DME instruction;Gait training;Stair training;Functional mobility training;Therapeutic activities;Therapeutic exercise;Balance training;Neuromuscular re-education;Patient/family education;Manual techniques;Modalities    Goals Home Exercise Program Pt will Perform Home Exercise Program: Independently PT Goal: Perform Home Exercise Program - Progress: Goal set today PT Short Term Goals Time to Complete Short Term Goals: 2 weeks PT Short Term Goal 1: Pt will improve Bilateral ankle ROM by 5 degrees in all directions PT Short Term Goal 2: Pt will improve ankle and hip strength by 1 muscle grade.  PT Short Term Goal 3: Pt will ambulate independently w/o CAM boot for 5 minutes in a closed environment. PT Long Term Goals Time to Complete Long Term Goals: 8 weeks PT Long Term Goal 1: Pt will report pain less than 4/10 to bilateral ankle when ambulating independently.  PT Long Term Goal 2: Pt will improve ankle AROM and hip strength to [facility] in order to ascend and descend stairs with 1 handrail with reciprocal pattern.  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  Long Term Goal 4: Pt will improve body mechanics and demonstrate appropriate gait mechaics to decrease risk of secondary injury.  PT Long Term Goal 5: Pt will improve her LEFS to 60/80 for improved QOL.   Problem List Patient Active Problem List  Diagnosis  . Left ankle sprain  . Fracture of right ankle, lateral malleolus  . Pain in joint, ankle and foot  . Difficulty in walking    PT - End of Session Activity Tolerance: Patient tolerated treatment well  LISA MASSIE, PT 09/05/2012, 1:25 PM  Physician Documentation Your signature is required to indicate approval of the treatment plan as stated above.  Please sign and either  send electronically or make a copy of this report for your files and return this physician signed original.   Please mark one 1.__approve of plan  2. ___approve of plan with the following conditions.   ________JDH______________________                                                          _____________________ Physician Signature                                                                                                             Date

## 2012-09-09 ENCOUNTER — Ambulatory Visit (HOSPITAL_COMMUNITY)
Admission: RE | Admit: 2012-09-09 | Discharge: 2012-09-09 | Disposition: A | Discharge status: Home / Self Care | Payer: BC Managed Care – PPO | Source: Ambulatory Visit | Attending: Orthopedic Surgery | Admitting: Orthopedic Surgery

## 2012-09-09 NOTE — Progress Notes (Signed)
Physical Therapy Treatment Patient Details  Name: Steven Gibbs MRN: [id number] Date of Birth: 31-Jul-1946  Today's Date: 09/09/2012 Time: 7253-6644 PT Time Calculation (min): 41 min Charges: 47' TE Visit#: 2  of 8   Re-eval: 10/05/12 Assessment Diagnosis: r ankle ORIF and L ankle sprain Surgical Date: 07/11/12  Authorization:    Authorization Time Period:    Authorization Visit#:   of     Subjective: Symptoms/Limitations Symptoms: Pt reports that he is doing very well.  he is doing his exercises about 5x a day and his pain is not as bad.  The left foot is feeling better.  He comes in today without his CAM boot. Explained to him importance to continue with CAM boot while ambulating in community for safety and per MD order.   Precautions/Restrictions  Restrictions Other Position/Activity Restrictions: WBAT in R CAM boot  Exercise/Treatments Stretches Gastroc Stretch: 3 reps;30 seconds (slant board) Soleus Stretch: 3 reps;30 seconds Standing Lateral Step Up: Both;10 reps;Hand Hold: 0;Step Height: 4" Forward Step Up: Both;10 reps Step Down: Both;5 reps;Hand Hold: 1;Step Height: 4" Rocker Board: 1 minute (L<>R and F<>B) Heel Raises: 10 reps Toe Raise: 10 reps Ankle Exercises - Seated Towel Inversion/Eversion: 5 reps;Weights Towel Inversion/Eversion Weights (lbs): 2 Marble Pickup: 4 reps BLE x10 marbles Heel Raises: 15 reps Toe Raise: 15 reps BAPS: Sitting;Level 2;10 reps (F<>B, S<>S, CW and CCW 10x each BLE)  Physical Therapy Assessment and Plan PT Assessment and Plan Clinical Impression Statement: Pt able to complete all activities in regular athletic shoe today without increased pain.  Had greatest difficulty with descending 4 in. stair and only completed 5 due to improper body mechanics.  Able to perform all other activities without pain or difficulty.  Instructed to to continue to wear CAM boot when ambulating at his house and outdoors and encouraged to do without AD.    PT Plan: Continue to improve ankle strength and ROM.  Continue to progress stair reps, LE strength, ankle ROM.  Increase BAPS as able.  continue with gait activities.     Goals    Problem List Patient Active Problem List  Diagnosis  . Left ankle sprain  . Fracture of right ankle, lateral malleolus  . Pain in joint, ankle and foot  . Difficulty in walking    PT - End of Session Activity Tolerance: Patient tolerated treatment well  GP    Steven Gibbs 09/09/2012, 1:47 PM

## 2012-09-11 ENCOUNTER — Ambulatory Visit (HOSPITAL_COMMUNITY)
Admission: RE | Admit: 2012-09-11 | Discharge: 2012-09-11 | Disposition: A | Discharge status: Home / Self Care | Payer: BC Managed Care – PPO | Source: Ambulatory Visit | Attending: Orthopedic Surgery | Admitting: Orthopedic Surgery

## 2012-09-11 NOTE — Progress Notes (Signed)
Physical Therapy Treatment Patient Details  Name: Steven Gibbs MRN: [id number] Date of Birth: 1945-11-05  Today's Date: 09/11/2012 Time: 7846-9629 PT Time Calculation (min): 53 min  Visit#: 3  of 8   Re-eval: 10/05/12 Assessment Diagnosis: r ankle ORIF and L ankle sprain Surgical Date: 07/11/12 Next MD Visit: Dr. Victorino Dike next week 12/04-05? Prior Therapy: None Charge: Gait 15', therex 38'  Subjective: Symptoms/Limitations Symptoms: Pt reports he feels like he is making good progress, feeling stronger.  R calf is a little sore following new activites complete last session, no real pain. Pain Assessment Currently in Pain?: No/denies  Precautions/Restrictions  Restrictions Weight Bearing Restrictions: No Other Position/Activity Restrictions: WBAT in R CAM boot  Exercise/Treatments Stretches Gastroc Stretch: 3 reps;30 seconds (slant board) Aerobic Tread Mill: gait training @ .55 cyc.sec x Standing Lateral Step Up: Both;10 reps;Hand Hold: 0;Step Height: 4" Forward Step Up: Both;Step Height: 4";15 reps;Hand Hold: 0 Step Down: Both;10 reps;Hand Hold: 1;Step Height: 4" Rocker Board: 2 minutes;Limitations Rocker Board Limitations: R/L and A/P  SLS: R 9", L 38: Gait Training: Gait training 4RT with 1 crutch for proper sequencing then amb with no AD  Ankle Exercises - Standing Heel Raises: 15 reps;Limitations Heel Raises Limitations: no HHA Toe Raise: 15 reps;Limitations Toe Raise Limitations: no HHA Ankle Exercises - Seated BAPS: Sitting;Level 2;10 reps;Limitations BAPS Limitations: Left and right  Physical Therapy Assessment and Plan PT Assessment and Plan Clinical Impression Statement: Session focus on improving gait mechanics and functional strengthening.  Improved body mechanics this session with 4in stair training, greatest difficulty continues with descending.  Pt able to demonstrate proper gait mechanics with no AD, min cueing required to equalize stride length.   Pt stated increased confidence with gait at end of session. PT Plan: Continue to improve ankle strength and ROM. Continue to progress stair reps, LE strength, ankle ROM. Increase BAPS as able. continue with gait activities    Goals    Problem List Patient Active Problem List  Diagnosis  . Left ankle sprain  . Fracture of right ankle, lateral malleolus  . Pain in joint, ankle and foot  . Difficulty in walking    PT - End of Session Activity Tolerance: Patient tolerated treatment well General Behavior During Session: [facility] for tasks performed Cognition: [facility] for tasks performed  GP    Juel Burrow 09/11/2012, 2:11 PM

## 2012-09-17 ENCOUNTER — Ambulatory Visit (HOSPITAL_COMMUNITY)
Admission: RE | Admit: 2012-09-17 | Discharge: 2012-09-17 | Disposition: A | Discharge status: Home / Self Care | Payer: BC Managed Care – PPO | Source: Ambulatory Visit | Attending: Orthopedic Surgery | Admitting: Orthopedic Surgery

## 2012-09-17 DIAGNOSIS — IMO0001 Reserved for inherently not codable concepts without codable children: Secondary | ICD-10-CM | POA: Insufficient documentation

## 2012-09-17 DIAGNOSIS — R262 Difficulty in walking, not elsewhere classified: Secondary | ICD-10-CM | POA: Insufficient documentation

## 2012-09-17 DIAGNOSIS — M25579 Pain in unspecified ankle and joints of unspecified foot: Secondary | ICD-10-CM | POA: Insufficient documentation

## 2012-09-17 NOTE — Evaluation (Signed)
Agree with above.  JDH 

## 2012-09-17 NOTE — Evaluation (Signed)
Physical Therapy Re-Evaluation/Progress Note for MD  Patient Details  Name: Steven Gibbs MRN: [id number] Date of Birth: 05-17-1946  Today's Date: 09/17/2012 Time: 0925-1011 PT Time Calculation (min): 46 min Charges: 1 MMT, 1 ROM, 26' TE; 15' Self Care Visit#: 4  of 8   Re-eval: 10/05/12 Assessment Diagnosis: r ankle ORIF and L ankle sprain Surgical Date: 07/11/12 Next MD Visit: Dr. Victorino Dike - 12/04  Subjective Symptoms/Limitations Symptoms: Pt entered dept ambulating with 1 crutch and athletic tennis shoes.  Reported no real pain just discomfort on L lateral calf region. He is using his ALSO on his R ankle and is w/o brace on L ankle.  Pain Assessment Currently in Pain?: No/denies  Sensation/Coordination/Flexibility/Functional Tests Functional Tests Functional Tests: LEFS: 59/80 (was 22/80)  RLE AROM (degrees) RLE Overall AROM Comments: Soleus: 8 degrees (was 2 degrees) Gastroc 5 degress (was 2 degrees) Right Ankle Plantar Flexion: 45  (was 32) Right Ankle Inversion: 32  (was 0) Right Ankle Eversion: 10  (was 0 degrees)  RLE Strength Right Ankle Dorsiflexion: 5/5 (was 4/5) Right Ankle Plantar Flexion: 5/5 (was 4/5) Right Ankle Inversion: 5/5 (was 3+/5) Right Ankle Eversion: 5/5 (was 3+/5)  LLE AROM (degrees) LLE Overall AROM Comments: Solues: 12 degrees (was 10)  Gastroc: 12 degrees (was 10 degrees) Left Ankle Plantar Flexion: 52  (was 52) Left Ankle Inversion: 32  (was 32) Left Ankle Eversion: 20  (was 18)  LLE Strength Left Ankle Dorsiflexion: 5/5 (was 3+/5) Left Ankle Plantar Flexion: 5/5 (was 3+/5) Left Ankle Inversion: 5/5 (was 3+/5) Left Ankle Eversion: 5/5 (was 3+/5)  Palpation: no pain to L knee   Exercise/Treatments Aerobic Exercises Tread Mill: Gait trainer x10 minutes @ 0.60 cyc/sec VC for stance time and stride length  Ankle Exercises - Standing Other Standing Ankle Exercises: Stairs 2 RT, 1 RT ascending w/power ups.  Reciprocal pattern 1  handrail Other Standing Ankle Exercises: Wall Squats w/8lb weights x5 ( holding w/10 bicep curls); step ups on 4 in. step 8lb weights with shoulder presses 1x5 BLE; standing lunges w/8lb weights 1x5 BLE  Physical Therapy Assessment and Plan PT Assessment and Plan Clinical Impression Statement: Mr. Gilbo has attended 4 OP PT visits s/p R ankle ORIF and L ankle sprain with the following findings: met all STG, 1/5 LTG and is progressing well towards remaining goals.  Ambulating with ALSO on R ankle  and CAM boot and single crutch occasionally.  He continues to have a moderate antalgic gait which is likely due to leg weakness as pt denies pain greater than a 2/10.  Has moderate pain to LEFT ankle with descending stairs.  Pt will benefit from skilled therapeutic intervention in order to improve on the following deficits: Abnormal gait;Pain;Decreased strength;Impaired perceived functional ability Rehab Potential: Good PT Frequency: Min 2X/week PT Duration: 4 weeks PT Plan: F/U w/MD apt/ Continue to progress functional strength and gait mechanics.  Encourage pelvic rotation w/weight shifting activities.     Goals Pt will Perform Home Exercise Program: Independently: Met (2-3 times a day) PT Short Term Goals: 2 weeks PT Short Term Goal 1: Pt will improve Bilateral ankle ROM by 5 degrees in all directions: Met PT Short Term Goal 2: Pt will improve ankle and hip strength by 1 muscle grade. : Met PT Short Term Goal 3: Pt will ambulate independently w/o CAM boot for 5 minutes in a closed environment.: Partly met (amb independently w/o CAM boot w/ brace on R ankle) PT Long Term Goals: 8 weeks PT  Long Term Goal 1: Pt will report pain less than 4/10 to bilateral ankle when ambulating independently. 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 : Progressing toward goal (using ALSO on R ankle, unable to climb ladder) Long Term Goal 4: Pt will improve body mechanics and demonstrate appropriate gait mechaics to decrease risk of secondary injury. : Progressing toward goal PT Long Term Goal 5: Pt will improve her LEFS to 60/80 for improved QOL. Progressing towards (59/80)  Problem List Patient Active Problem List  Diagnosis  . Left ankle sprain  . Fracture of right ankle, lateral malleolus  . Pain in joint, ankle and foot  . Difficulty in walking    PT - End of Session Activity Tolerance: Patient tolerated treatment well General Behavior During Session: [facility] for tasks performed Cognition: [facility] for tasks performed  Latisha Lasch, PT 09/17/2012, 10:36 AM  Physician Documentation Your signature is required to indicate approval of the treatment plan as stated above.  Please sign and either send electronically or make a copy of this report for your files and return this physician signed original.   Please mark one 1.__approve of plan  2. ___approve of plan with the following conditions.   ______________________________                                                          _____________________ Physician Signature                                                                                                             Date

## 2012-09-19 ENCOUNTER — Ambulatory Visit (HOSPITAL_COMMUNITY)
Admission: RE | Admit: 2012-09-19 | Discharge: 2012-09-19 | Disposition: A | Discharge status: Home / Self Care | Payer: BC Managed Care – PPO | Source: Ambulatory Visit | Attending: Orthopedic Surgery | Admitting: Orthopedic Surgery

## 2012-09-19 NOTE — Progress Notes (Signed)
Physical Therapy Treatment Patient Details  Name: Steven Gibbs MRN: [id number] Date of Birth: October 31, 1945  Today's Date: 09/19/2012 Time: 0932-1016 PT Time Calculation (min): 44 min Visit#: 5  of 8   Re-eval: 10/05/12 Charges:  therex 40'    Subjective: Symptoms/Limitations Symptoms: Pt. states MD was very pleased with progress and has discharged him; wants him to continue out his therapy.  Pt reports going down stairs continues to be the most difficult.   Pain Assessment Currently in Pain?: No/denies   Exercise/Treatments Stretches Soleus Stretch: 3 reps;30 seconds;Limitations Soleus Stretch Limitations: shown against wall, on step for HEP and with slant board Aerobic Tread Mill: gait training @ .60 cyc.sec x Standing Heel Raises: 10 reps;Limitations Heel Raises Limitations: singles, bilaterally Lunge Walking - Round Trips: 2RT Stairs: 2RT with power ups ascending reciprocally 1 HR Other Standing Knee Exercises: static lunges 15 reps each, 8# dumbells Wall slides 15 reps with 8# dumbells bicep curls Seated Other Seated Knee Exercises: anterior tib stretch for R ; sit cross leg and pull toe back for HEP     Physical Therapy Assessment and Plan PT Assessment and Plan Clinical Impression Statement: Instructed with soleus stretch to target tightness in L ankle and anterior tib stretch for R dorsal ankle/shin discomfort/tightness.  Pt. pleased withi results and able to demonstrate correctly.  Added  progressive lunges with cues for forward posture and vector stance B to increase stability.   PT Plan: Continue to progress functional strength and gait mechanics.  Encourage pelvic rotation w/weight shifting activities.      Problem List Patient Active Problem List  Diagnosis  . Left ankle sprain  . Fracture of right ankle, lateral malleolus  . Pain in joint, ankle and foot  . Difficulty in walking    PT - End of Session Activity Tolerance: Patient tolerated treatment  well General Behavior During Session: [facility] for tasks performed Cognition: [facility] for tasks performed    [name], PTA/CLT 09/19/2012, 10:23 AM

## 2012-09-24 ENCOUNTER — Ambulatory Visit (HOSPITAL_COMMUNITY)
Admission: RE | Admit: 2012-09-24 | Discharge: 2012-09-24 | Disposition: A | Discharge status: Home / Self Care | Payer: BC Managed Care – PPO | Source: Ambulatory Visit | Attending: Orthopedic Surgery | Admitting: Orthopedic Surgery

## 2012-09-24 NOTE — Progress Notes (Signed)
Physical Therapy Treatment Patient Details  Name: Steven Gibbs MRN: [id number] Date of Birth: [birth year]  Today's Date: 09/24/2012 Time: 0930-1012 PT Time Calculation (min): 42 min  Visit#: 6  of 8   Re-eval: 10/05/12 Assessment Diagnosis: r ankle ORIF and L ankle sprain Surgical Date: 07/11/12 Next MD Visit: Dr. Victorino Dike  Charge: Gait 10', therex 32'  Subjective: Symptoms/Limitations Symptoms: Pt reported soreness, general pain scale 2/10 lateral calf BLE.  Pt thinks he may have done too much over weekend walking and stairs. Pain Assessment Currently in Pain?: Yes Pain Score:   2 Pain Location: Leg (lower leg muscles) Pain Orientation: Lateral;Right;Left  Precautions/Restrictions  Restrictions Weight Bearing Restrictions: No Other Position/Activity Restrictions: WBAT in normal athletic shoes  Exercise/Treatments Stretches Soleus Stretch: 3 reps;30 seconds;Limitations Aerobic Tread Mill: 1.5-->2.0 x last 5 min without HHA Machines for Strengthening Cybex Knee Extension: 4.5 Pl x 15 Cybex Knee Flexion: 5 Pl 15x Cybex Leg Press: 7Pl 15 reps Standing Lunge Walking - Round Trips: 2RT Stairs: 2RT with power ups ascending reciprocally 1 HR SLS with Vectors: 5x 5" BLE Other Standing Knee Exercises: floor to standing 5 x     Physical Therapy Assessment and Plan PT Assessment and Plan Clinical Impression Statement: Pt instructed weight machines with proper technique and weight to be used for return to the [facility] for LE strengthening.  Added floor to standing exercises to assist with functional tasks at home.  Continued with vector stance for improved hip mobility, balance and stability. PT Plan: Continue to progress functional strength and gait mechanics. Encourage pelvic rotation w/weight shifting activities    Goals    Problem List Patient Active Problem List  Diagnosis  . Left ankle sprain  . Fracture of right ankle, lateral malleolus  . Pain in joint, ankle  and foot  . Difficulty in walking    PT - End of Session Activity Tolerance: Patient tolerated treatment well General Behavior During Session: [facility] for tasks performed Cognition: [facility] for tasks performed  GP    Juel Burrow 09/24/2012, 10:53 AM

## 2012-09-26 ENCOUNTER — Ambulatory Visit (HOSPITAL_COMMUNITY)
Admission: RE | Admit: 2012-09-26 | Discharge: 2012-09-26 | Disposition: A | Discharge status: Home / Self Care | Payer: BC Managed Care – PPO | Source: Ambulatory Visit | Attending: Orthopedic Surgery | Admitting: Orthopedic Surgery

## 2012-09-26 NOTE — Evaluation (Signed)
Physical Therapy Re-evaluation / Discharge  Patient Details  Name: Steven Gibbs MRN: [id number] Date of Birth: [birth year]  Today's Date: 09/26/2012 Time: 0930-1020 PT Time Calculation (min): 50 min Charges:  therex 45' Visit#: 7  of 8   Re-eval: 10/05/12 Diagnosis: r ankle ORIF and L ankle sprain Next MD Visit: Dr. Victorino Dike   Subjective Symptoms/Limitations Symptoms: Pt. reports he is doing well; only has minimal pain when ambulating a long time.  Precautions/Restrictions  Restrictions Weight Bearing Restrictions: No  Sensation/Coordination/Flexibility/Functional Tests Functional Tests Functional Tests: LEFS: 62/80 (was 59/80)  Objective: (per measurements 09/17/12) RLE AROM (degrees) Right Soleus: 8 degrees (was 2 degrees) Right Gastroc 5 degress (was 2 degrees) Right Ankle Plantar Flexion: 45  Right Ankle Inversion: 32  Right Ankle Eversion: 10   RLE Strength Right Ankle Dorsiflexion: 5/5 Right Ankle Plantar Flexion: 5/5 Right Ankle Inversion: 5/5 Right Ankle Eversion: 5/5  LLE AROM (degrees) Left Soleus: 12 degrees (was 10)   Gastroc: 12 degrees (was 10 degrees) Left Ankle Plantar Flexion: 52  Left Ankle Inversion: 32  Left Ankle Eversion: 20   LLE Strength Left Ankle Dorsiflexion: 5/5 Left Ankle Plantar Flexion: 5/5 Left Ankle Inversion: 5/5 Left Ankle Eversion: 5/5  Exercise/Treatments Stretches Soleus Stretch: 3 reps;30 seconds;Limitations Aerobic Tread Mill: 1.5-->2.0 x no HHA Machines for Strengthening Cybex Knee Extension: 4.5 Pl x 15 Cybex Knee Flexion: 5 Pl 15x Cybex Leg Press: 7Pl 15 reps Standing Heel Raises: 10 reps;Limitations Heel Raises Limitations: singles, bilaterally Stairs: 2RT with power ups ascending reciprocally 1 HR SLS with Vectors: 5x 5" BLE   Physical Therapy Assessment and Plan PT Assessment and Plan Clinical Impression Statement: Mr. Victorino Dike has attend 7 OP PT visits with following findings:  met all goals;  ambulating independently in community without AD or ALSO.  States he is compliant with HEP and plans on returning to [facility] to continue/maintain current functional gains. D/C with HEP PT Plan: D/C with HEP  Goals Pt will Perform Home Exercise Program: Independently: Met  PT Short Term Goals: 2 weeks PT Short Term Goal 1: Pt will improve Bilateral ankle ROM by 5 degrees in all directions: Met PT Short Term Goal 2: Pt will improve ankle and hip strength by 1 muscle grade. : Met PT Short Term Goal 3: Pt will ambulate independently w/o CAM boot for 5 minutes in a closed environment.: Met (No longer using CAM boot)  PT Long Term Goals: 8 weeks PT Long Term Goal 1: Pt will report pain less than 4/10 to bilateral ankle when ambulating independently. : Met PT Long Term Goal 2: Pt will improve ankle AROM and hip strength to [facility] in order to ascend and descend stairs with 1 handrail with reciprocal pattern. 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 : Met Long Term Goal 4: Pt will improve body mechanics and demonstrate appropriate gait mechaics to decrease risk of secondary injury. : Met PT Long Term Goal 5: Pt will improve her LEFS to 60/80 for improved QOL. : Met  Problem List Patient Active Problem List  Diagnosis  . Left ankle sprain  . Fracture of right ankle, lateral malleolus  . Pain in joint, ankle and foot  . Difficulty in walking    PT - End of Session Activity Tolerance: Patient tolerated treatment well General Behavior During Session: [facility] for tasks performed Cognition: [facility] for tasks performed   Amy B  Bascom Levels, PTA/CLT 09/26/2012, 10:56 AM

## 2012-09-27 NOTE — Evaluation (Signed)
jdh

## 2017-02-16 ENCOUNTER — Other Ambulatory Visit (HOSPITAL_COMMUNITY): Payer: Self-pay | Admitting: Internal Medicine

## 2017-02-16 ENCOUNTER — Ambulatory Visit (HOSPITAL_COMMUNITY)
Admission: RE | Admit: 2017-02-16 | Discharge: 2017-02-16 | Disposition: A | Discharge status: Home / Self Care | Payer: Medicare Other | Source: Ambulatory Visit | Attending: Internal Medicine | Admitting: Internal Medicine

## 2017-02-16 DIAGNOSIS — R0602 Shortness of breath: Secondary | ICD-10-CM | POA: Diagnosis not present

## 2019-11-30 ENCOUNTER — Ambulatory Visit: Payer: Self-pay | Attending: Internal Medicine

## 2019-11-30 ENCOUNTER — Other Ambulatory Visit: Payer: Self-pay

## 2019-11-30 DIAGNOSIS — Z23 Encounter for immunization: Secondary | ICD-10-CM | POA: Insufficient documentation

## 2019-11-30 NOTE — Progress Notes (Signed)
   Covid-19 Vaccination Clinic  Name:  Steven Gibbs    MRN: [id number] DOB: 1946-03-24  11/30/2019  [name] was observed post Covid-19 immunization for 15 minutes without incidence. He was provided with Vaccine Information Sheet and instruction to access the V-Safe system.   [name] was instructed to call 911 with any severe reactions post vaccine: Marland Kitchen Difficulty breathing  . Swelling of your face and throat  . A fast heartbeat  . A bad rash all over your body  . Dizziness and weakness    Immunizations Administered    Name Date Dose VIS Date Route   Moderna COVID-19 Vaccine 11/30/2019  3:18 PM 0.5 mL 09/16/2019 Intramuscular   Manufacturer: Moderna   Lot: [id number]   NDC: 34483-015-99

## 2019-12-28 ENCOUNTER — Ambulatory Visit: Payer: Medicare PPO | Attending: Internal Medicine

## 2019-12-28 DIAGNOSIS — Z23 Encounter for immunization: Secondary | ICD-10-CM

## 2019-12-28 NOTE — Progress Notes (Signed)
   Covid-19 Vaccination Clinic  Name:  Steven Gibbs    MRN: [id number] DOB: 1946-07-08  12/28/2019  Mr. Wayson was observed post Covid-19 immunization for 15 minutes without incident. He was provided with Vaccine Information Sheet and instruction to access the V-Safe system.   Mr. Shelton was instructed to call 911 with any severe reactions post vaccine: Marland Kitchen Difficulty breathing  . Swelling of face and throat  . A fast heartbeat  . A bad rash all over body  . Dizziness and weakness   Immunizations Administered    Name Date Dose VIS Date Route   Moderna COVID-19 Vaccine 12/28/2019  1:42 PM 0.5 mL 09/16/2019 Intramuscular   Manufacturer: Moderna   Lot: [id number]   NDC: 87183-672-55

## 2020-03-03 DIAGNOSIS — Z6828 Body mass index (BMI) 28.0-28.9, adult: Secondary | ICD-10-CM | POA: Diagnosis not present

## 2020-03-03 DIAGNOSIS — I10 Essential (primary) hypertension: Secondary | ICD-10-CM | POA: Diagnosis not present

## 2020-03-03 DIAGNOSIS — R251 Tremor, unspecified: Secondary | ICD-10-CM | POA: Diagnosis not present

## 2020-09-07 DIAGNOSIS — Z125 Encounter for screening for malignant neoplasm of prostate: Secondary | ICD-10-CM | POA: Diagnosis not present

## 2020-09-07 DIAGNOSIS — I10 Essential (primary) hypertension: Secondary | ICD-10-CM | POA: Diagnosis not present

## 2020-09-07 DIAGNOSIS — R251 Tremor, unspecified: Secondary | ICD-10-CM | POA: Diagnosis not present

## 2020-09-07 DIAGNOSIS — Z79899 Other long term (current) drug therapy: Secondary | ICD-10-CM | POA: Diagnosis not present

## 2020-09-14 DIAGNOSIS — I10 Essential (primary) hypertension: Secondary | ICD-10-CM | POA: Diagnosis not present

## 2020-09-14 DIAGNOSIS — Z23 Encounter for immunization: Secondary | ICD-10-CM | POA: Diagnosis not present

## 2020-09-14 DIAGNOSIS — E785 Hyperlipidemia, unspecified: Secondary | ICD-10-CM | POA: Diagnosis not present

## 2021-03-15 ENCOUNTER — Ambulatory Visit (HOSPITAL_COMMUNITY)
Admission: RE | Admit: 2021-03-15 | Discharge: 2021-03-15 | Disposition: A | Discharge status: Home / Self Care | Payer: Medicare PPO | Source: Ambulatory Visit | Attending: Internal Medicine | Admitting: Internal Medicine

## 2021-03-15 ENCOUNTER — Other Ambulatory Visit (HOSPITAL_COMMUNITY): Payer: Self-pay | Admitting: Internal Medicine

## 2021-03-15 ENCOUNTER — Other Ambulatory Visit: Payer: Self-pay

## 2021-03-15 DIAGNOSIS — R0781 Pleurodynia: Secondary | ICD-10-CM

## 2021-03-15 DIAGNOSIS — I10 Essential (primary) hypertension: Secondary | ICD-10-CM | POA: Diagnosis not present

## 2021-03-15 DIAGNOSIS — I700 Atherosclerosis of aorta: Secondary | ICD-10-CM | POA: Diagnosis not present

## 2021-03-15 DIAGNOSIS — J8410 Pulmonary fibrosis, unspecified: Secondary | ICD-10-CM | POA: Diagnosis not present

## 2021-03-15 DIAGNOSIS — L820 Inflamed seborrheic keratosis: Secondary | ICD-10-CM | POA: Diagnosis not present

## 2021-04-20 DIAGNOSIS — D225 Melanocytic nevi of trunk: Secondary | ICD-10-CM | POA: Diagnosis not present

## 2021-04-20 DIAGNOSIS — D485 Neoplasm of uncertain behavior of skin: Secondary | ICD-10-CM | POA: Diagnosis not present

## 2021-04-20 DIAGNOSIS — C44519 Basal cell carcinoma of skin of other part of trunk: Secondary | ICD-10-CM | POA: Diagnosis not present

## 2021-05-16 DIAGNOSIS — L988 Other specified disorders of the skin and subcutaneous tissue: Secondary | ICD-10-CM | POA: Diagnosis not present

## 2021-05-16 DIAGNOSIS — D225 Melanocytic nevi of trunk: Secondary | ICD-10-CM | POA: Diagnosis not present

## 2021-05-16 DIAGNOSIS — D485 Neoplasm of uncertain behavior of skin: Secondary | ICD-10-CM | POA: Diagnosis not present

## 2021-06-13 DIAGNOSIS — L988 Other specified disorders of the skin and subcutaneous tissue: Secondary | ICD-10-CM | POA: Diagnosis not present

## 2021-06-13 DIAGNOSIS — D225 Melanocytic nevi of trunk: Secondary | ICD-10-CM | POA: Diagnosis not present

## 2021-06-13 DIAGNOSIS — D485 Neoplasm of uncertain behavior of skin: Secondary | ICD-10-CM | POA: Diagnosis not present

## 2021-07-06 DIAGNOSIS — L988 Other specified disorders of the skin and subcutaneous tissue: Secondary | ICD-10-CM | POA: Diagnosis not present

## 2021-07-06 DIAGNOSIS — D485 Neoplasm of uncertain behavior of skin: Secondary | ICD-10-CM | POA: Diagnosis not present

## 2021-07-06 DIAGNOSIS — D225 Melanocytic nevi of trunk: Secondary | ICD-10-CM | POA: Diagnosis not present

## 2021-09-06 DIAGNOSIS — R413 Other amnesia: Secondary | ICD-10-CM | POA: Diagnosis not present

## 2021-09-06 DIAGNOSIS — Z125 Encounter for screening for malignant neoplasm of prostate: Secondary | ICD-10-CM | POA: Diagnosis not present

## 2021-09-06 DIAGNOSIS — E785 Hyperlipidemia, unspecified: Secondary | ICD-10-CM | POA: Diagnosis not present

## 2021-09-06 DIAGNOSIS — Z79899 Other long term (current) drug therapy: Secondary | ICD-10-CM | POA: Diagnosis not present

## 2021-09-06 DIAGNOSIS — I10 Essential (primary) hypertension: Secondary | ICD-10-CM | POA: Diagnosis not present

## 2021-09-14 ENCOUNTER — Other Ambulatory Visit: Payer: Self-pay | Admitting: Internal Medicine

## 2021-09-14 ENCOUNTER — Other Ambulatory Visit (HOSPITAL_COMMUNITY): Payer: Self-pay | Admitting: Internal Medicine

## 2021-09-14 DIAGNOSIS — R413 Other amnesia: Secondary | ICD-10-CM

## 2021-09-14 DIAGNOSIS — E785 Hyperlipidemia, unspecified: Secondary | ICD-10-CM | POA: Diagnosis not present

## 2021-09-14 DIAGNOSIS — I10 Essential (primary) hypertension: Secondary | ICD-10-CM | POA: Diagnosis not present

## 2021-09-14 DIAGNOSIS — Z23 Encounter for immunization: Secondary | ICD-10-CM | POA: Diagnosis not present

## 2021-09-14 DIAGNOSIS — R419 Unspecified symptoms and signs involving cognitive functions and awareness: Secondary | ICD-10-CM | POA: Diagnosis not present

## 2021-09-19 DIAGNOSIS — R413 Other amnesia: Secondary | ICD-10-CM | POA: Diagnosis not present

## 2021-09-23 ENCOUNTER — Ambulatory Visit (HOSPITAL_COMMUNITY)
Admission: RE | Admit: 2021-09-23 | Discharge: 2021-09-23 | Disposition: A | Discharge status: Home / Self Care | Payer: Medicare PPO | Source: Ambulatory Visit | Attending: Internal Medicine | Admitting: Internal Medicine

## 2021-09-23 ENCOUNTER — Other Ambulatory Visit: Payer: Self-pay

## 2021-09-23 DIAGNOSIS — G319 Degenerative disease of nervous system, unspecified: Secondary | ICD-10-CM | POA: Diagnosis not present

## 2021-09-23 DIAGNOSIS — R413 Other amnesia: Secondary | ICD-10-CM | POA: Insufficient documentation

## 2021-09-29 DIAGNOSIS — H9201 Otalgia, right ear: Secondary | ICD-10-CM | POA: Diagnosis not present

## 2021-09-29 DIAGNOSIS — R413 Other amnesia: Secondary | ICD-10-CM | POA: Diagnosis not present

## 2021-09-29 DIAGNOSIS — D519 Vitamin B12 deficiency anemia, unspecified: Secondary | ICD-10-CM | POA: Diagnosis not present

## 2021-11-09 ENCOUNTER — Ambulatory Visit: Payer: Medicare PPO | Admitting: Neurology

## 2021-11-09 ENCOUNTER — Other Ambulatory Visit: Payer: Self-pay

## 2021-11-09 ENCOUNTER — Encounter: Payer: Self-pay | Admitting: Neurology

## 2021-11-09 VITALS — BP 186/102 | HR 48 | Ht 68.0 in | Wt 153.3 lb

## 2021-11-09 DIAGNOSIS — R4189 Other symptoms and signs involving cognitive functions and awareness: Secondary | ICD-10-CM

## 2021-11-09 NOTE — Progress Notes (Signed)
[facility]  PATIENT: Steven Gibbs DOB: [birth year]  REQUESTING CLINICIAN: Carylon PerchesFagan, Roy, MD HISTORY FROM: Patient and Spouse  REASON FOR VISIT: Memory decline    HISTORICAL  CHIEF COMPLAINT:  Chief Complaint  Patient presents with   New Patient (Initial Visit)    Room 12 w/ wife, Steven Gibbs. New patient with memory concerns.     HISTORY OF PRESENT ILLNESS:  This is a 76 year old gentleman with past medical history of hypertension, hyperlipidemia, and B12 deficiency who is presenting with worsening memory.  Per patient he reported he feels fine, sometimes he has difficulty finding the right words but other than that his memory is okay.  He is still driving, denies any recent accident, denies being lost in familiar places.  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  He does chores in the house, denies being distracted easily.  Reports wife handles the bills and she has been doing it for a long time.   Per wife, she noted some issue with word finding difficulty.  She said this is intermittent some days he is doing well and other days he has trouble finding the right words.  Other than that he can recall recent conversation, sometimes he may have issue with conversation that happened a couple weeks ago, and reports she does not have to repeat herself multiple times to remind patient.   He still independent, cooks, cleans, drives his car, able to do all activities of daily living.  He also goes fishing and goes hunting. They live with their 74 year old son who has autism spectrum disorder and possibly intellectual disability and at times it is hard.  For instance if he wants to go fishing and the sons want to go watch a movie he has to go watch movie and sometimes it just interfere with his activities. [place], and he does a lot of work there.     OTHER MEDICAL CONDITIONS: HTN, HLD, B12 deficiency     REVIEW OF SYSTEMS: Full 14 system review of systems performed and negative with exception of: as noted in the HPI   ALLERGIES: No Known Allergies  HOME MEDICATIONS: Outpatient Medications Prior to Visit  Medication Sig Dispense Refill   atorvastatin (LIPITOR) 10 MG tablet Take 10 mg by mouth daily.     Cyanocobalamin (B-12 PO) Take 1,000 mcg by mouth daily.     diltiazem (DILACOR XR) 180 MG 24 hr capsule Take 180 mg by mouth daily.     metoprolol succinate (TOPROL-XL) 50 MG 24 hr tablet Take 50 mg by mouth daily. Take with or immediately following a meal.     fenofibrate 160 MG tablet Take 160 mg by mouth daily.     oxyCODONE (ROXICODONE) 5 MG immediate release tablet Take 1-2 tablets (5-10 mg total) by mouth every 4 (four) hours as needed for pain. 40 tablet 0   No facility-administered medications prior to visit.    PAST MEDICAL HISTORY: Past Medical History:  Diagnosis Date   High cholesterol    Hypertension    Memory loss     PAST SURGICAL HISTORY: Past Surgical History:  Procedure Laterality Date   BACK SURGERY     ORIF ANKLE FRACTURE  07/11/2012   Procedure: OPEN REDUCTION INTERNAL FIXATION (ORIF) ANKLE FRACTURE;  Surgeon: Toni ArthursJohn Hewitt, MD;  Location: [facility];  Service: Orthopedics;  Laterality: Right;  Lateral malleous /  syndesmosis disruption  FAMILY HISTORY: Family History  Problem Relation Age of Onset   Other Mother        complications from hip replacment surgery - blood clot   Heart failure Father     SOCIAL HISTORY: Social History   Socioeconomic History   Marital status: Married    Spouse name: Not on file   Number of children: 1   Years of education: college   Highest education level: Not on file  Occupational History   Occupation: Retired  Tobacco Use   Smoking status: Former    Types: Cigarettes    Quit date: 07/08/1982    Years since quitting: 39.3   Smokeless tobacco: Not on file  Substance and Sexual Activity    Alcohol use: Yes    Comment: social   Drug use: Never   Sexual activity: Not on file  Other Topics Concern   Not on file  Social History Narrative   Lives with his wife.   Right-handed.   Caffeine use: 1.5 cups per day.   Social Determinants of Health   Financial Resource Strain: Not on file  Food Insecurity: Not on file  Transportation Needs: Not on file  Physical Activity: Not on file  Stress: Not on file  Social Connections: Not on file  Intimate Partner Violence: Not on file    PHYSICAL EXAM  GENERAL EXAM/CONSTITUTIONAL: Vitals:  Vitals:   11/09/21 1035  BP: (!) 186/102  Pulse: (!) 48  Weight: 153 lb 5 oz (69.5 kg)  Height: 5\' 8"  (1.727 m)   Body mass index is 23.31 kg/m. Wt Readings from Last 3 Encounters:  11/09/21 153 lb 5 oz (69.5 kg)  07/11/12 180 lb (81.6 kg)  07/05/12 180 lb (81.6 kg)   Patient is in no distress; well developed, nourished and groomed; neck is supple  CARDIOVASCULAR: Examination of carotid arteries is normal; no carotid bruits Regular rate and rhythm, no murmurs Examination of peripheral vascular system by observation and palpation is normal  EYES: Pupils round and reactive to light, Visual fields full to confrontation, Extraocular movements intacts,   MUSCULOSKELETAL: Gait, strength, tone, movements noted in Neurologic exam below  NEUROLOGIC: MENTAL STATUS:  MMSE - Mini Mental State Exam 11/09/2021  Orientation to time 2  Orientation to Place 4  Registration 3  Attention/ Calculation 0  Recall 0  Language- name 2 objects 2  Language- repeat 1  Language- follow 3 step command 3  Language- read & follow direction 1  Write a sentence 1  Copy design 0  Copy design-comments 4-legged animals in one minute: 7  Total score 17    CRANIAL NERVE:  2nd, 3rd, 4th, 6th - pupils equal and reactive to light, visual fields full to confrontation, extraocular muscles intact, no nystagmus 5th - facial sensation symmetric 7th - facial  strength symmetric 8th - hearing intact 9th - palate elevates symmetrically, uvula midline 11th - shoulder shrug symmetric 12th - tongue protrusion midline  MOTOR:  normal bulk and tone, full strength in the BUE, BLE  SENSORY:  normal and symmetric to light touch, pinprick, temperature, vibration  COORDINATION:  finger-nose-finger, fine finger movements normal  REFLEXES:  deep tendon reflexes present and symmetric  GAIT/STATION:  normal    DIAGNOSTIC DATA (LABS, IMAGING, TESTING) - I reviewed patient records, labs, notes, testing and imaging myself where available.  Lab Results  Component Value Date   HGB 13.6 07/11/2012   HCT 40.0 07/11/2012      Component Value Date/Time  NA 139 07/11/2012 0842   K 4.1 07/11/2012 0842   CL 102 07/11/2012 0842   GLUCOSE 106 (H) 07/11/2012 0842   BUN 24 (H) 07/11/2012 0842   CREATININE 1.20 07/11/2012 0842   No results found for: CHOL, HDL, LDLCALC, LDLDIRECT, TRIG, CHOLHDL No results found for: OEVO3J No results found for: VITAMINB12 No results found for: TSH  TSH 2.460 B 12 226  MRI Brain 09/2021 No evidence of acute intracranial abnormality. Small focus of chronic encephalomalacia within the inferolateral left temporal lobe, possibly posttraumatic in this location. Minimal chronic small-vessel ischemic changes within the cerebral white matter. Mild generalized cerebral and cerebellar atrophy. Large left mastoid effusion.    ASSESSMENT AND PLAN  76 y.o. year old male with hypertension, hyperlipidemia, and B12 deficiency who is presenting with memory problem for the past year.  Memory problem described as word finding difficulty.  There are no report of patient being lost in familiar places, no report of being repetitive, having to ask the same question over and over.  He did score a 17 out of 30 on a Mini-Mental status evaluation but patient reported he did not care for test (MMSE).  I am not certain if this test is valid  because of what he mentioned but I still have to keep it in the overall picture.  He appears to be well functioning to score a 17 out of 30 on a Mini-Mental status exam.  I have outlined to the patient that since wife mentioned that these episodes tend to be episodic I want to rule out seizures with a EEG.  I also want to send him for a formal neuropsychological testing to rule out depression.  He was upset and tearful when discussing about his 67 year old son with autism spectrum disorder living in his house.  I will contact the patient after completion of the EEG otherwise I will see him in 6 months for follow-up.  Hopefully at that time he will complete his neuropsychological testing.   1. Cognitive impairment      Patient Instructions  Routine EEG  Referral to Neuropsychiatry  Follow up in 6 months   Orders Placed This Encounter  Procedures   Ambulatory referral to Neuropsychology   EEG adult    No orders of the defined types were placed in this encounter.   Return in about 6 months (around 05/09/2022).    Windell Norfolk, MD 11/09/2021, 1:33 PM  [facility] [address], [address] [place], [place] [address] [phone]

## 2021-11-09 NOTE — Patient Instructions (Signed)
Routine EEG  Referral to Neuropsychiatry  Follow up in 6 months

## 2021-11-11 ENCOUNTER — Encounter: Payer: Self-pay | Admitting: Psychology

## 2021-11-17 ENCOUNTER — Ambulatory Visit: Payer: Medicare PPO | Admitting: Neurology

## 2021-11-17 DIAGNOSIS — R410 Disorientation, unspecified: Secondary | ICD-10-CM

## 2021-11-17 DIAGNOSIS — R4189 Other symptoms and signs involving cognitive functions and awareness: Secondary | ICD-10-CM

## 2021-11-17 NOTE — Procedures (Signed)
° ° °  History:  42 with mild cognitive impairment and episode of confusion.   EEG classification: Awake and drowsy  Description of the recording: The background rhythms of this recording consists of a fairly well modulated medium amplitude alpha rhythm of 8 Hz that is reactive to eye opening and closure. As the record progresses, the patient appears to remain in the waking state throughout the recording. Photic stimulation was performed, did not show any abnormalities. Hyperventilation was also performed, did not show any abnormalities. Toward the end of the recording, the patient enters the drowsy state with slight symmetric slowing seen. The patient never enters stage II sleep. No abnormal epileptiform discharges seen during this recording. There was mild intermittent diffuse slowing. EKG monitor shows no evidence of cardiac rhythm abnormalities with a heart rate of 60.  Impression: This is an abnormal EEG recording in the waking and drowsy state due to mild intermittent diffuse slowing. Diffuse slowing is consistent with a generalized brain dysfunction such as in encephalopathy. No seizures captured during this study.    Alric Ran, MD [facility]

## 2021-11-21 NOTE — Progress Notes (Signed)
Spoke with Steven Gibbs (patient spouse), discussed EEG results showing mild gen slowing, all questions answered. His neuropsych testing is scheduled for august. I am going to move our follow up appointment from July to October. She is comfortable with plans.   Alric Ran, MD

## 2021-11-22 ENCOUNTER — Telehealth: Payer: Self-pay | Admitting: Neurology

## 2021-11-22 NOTE — Telephone Encounter (Signed)
Abigail Butts from Dr. Knute Neu office is needing the OV notes faxed over as soon as possible. Fax number is [phone] attention Abigail Butts.

## 2021-12-26 DIAGNOSIS — Z79899 Other long term (current) drug therapy: Secondary | ICD-10-CM | POA: Diagnosis not present

## 2021-12-26 DIAGNOSIS — R413 Other amnesia: Secondary | ICD-10-CM | POA: Diagnosis not present

## 2021-12-26 DIAGNOSIS — D519 Vitamin B12 deficiency anemia, unspecified: Secondary | ICD-10-CM | POA: Diagnosis not present

## 2022-01-03 DIAGNOSIS — G3184 Mild cognitive impairment, so stated: Secondary | ICD-10-CM | POA: Diagnosis not present

## 2022-01-03 DIAGNOSIS — D519 Vitamin B12 deficiency anemia, unspecified: Secondary | ICD-10-CM | POA: Diagnosis not present

## 2022-01-04 DIAGNOSIS — D485 Neoplasm of uncertain behavior of skin: Secondary | ICD-10-CM | POA: Diagnosis not present

## 2022-01-04 DIAGNOSIS — Z1283 Encounter for screening for malignant neoplasm of skin: Secondary | ICD-10-CM | POA: Diagnosis not present

## 2022-01-04 DIAGNOSIS — D225 Melanocytic nevi of trunk: Secondary | ICD-10-CM | POA: Diagnosis not present

## 2022-01-18 DIAGNOSIS — L988 Other specified disorders of the skin and subcutaneous tissue: Secondary | ICD-10-CM | POA: Diagnosis not present

## 2022-01-18 DIAGNOSIS — D225 Melanocytic nevi of trunk: Secondary | ICD-10-CM | POA: Diagnosis not present

## 2022-01-18 DIAGNOSIS — L98429 Non-pressure chronic ulcer of back with unspecified severity: Secondary | ICD-10-CM | POA: Diagnosis not present

## 2022-01-18 DIAGNOSIS — D485 Neoplasm of uncertain behavior of skin: Secondary | ICD-10-CM | POA: Diagnosis not present

## 2022-01-25 DIAGNOSIS — H903 Sensorineural hearing loss, bilateral: Secondary | ICD-10-CM | POA: Diagnosis not present

## 2022-03-07 DIAGNOSIS — D225 Melanocytic nevi of trunk: Secondary | ICD-10-CM | POA: Diagnosis not present

## 2022-03-07 DIAGNOSIS — D485 Neoplasm of uncertain behavior of skin: Secondary | ICD-10-CM | POA: Diagnosis not present

## 2022-04-12 DIAGNOSIS — R001 Bradycardia, unspecified: Secondary | ICD-10-CM | POA: Diagnosis not present

## 2022-04-12 DIAGNOSIS — G3184 Mild cognitive impairment, so stated: Secondary | ICD-10-CM | POA: Diagnosis not present

## 2022-05-09 ENCOUNTER — Ambulatory Visit: Payer: Medicare PPO | Admitting: Neurology

## 2022-06-08 ENCOUNTER — Encounter: Payer: Medicare PPO | Admitting: Psychology

## 2022-06-15 ENCOUNTER — Encounter: Payer: Medicare PPO | Admitting: Psychology

## 2022-07-12 DIAGNOSIS — I10 Essential (primary) hypertension: Secondary | ICD-10-CM | POA: Diagnosis not present

## 2022-07-12 DIAGNOSIS — G3184 Mild cognitive impairment, so stated: Secondary | ICD-10-CM | POA: Diagnosis not present

## 2022-07-13 DIAGNOSIS — Z23 Encounter for immunization: Secondary | ICD-10-CM | POA: Diagnosis not present

## 2022-07-20 ENCOUNTER — Ambulatory Visit: Payer: Medicare PPO | Admitting: Neurology

## 2022-08-16 DIAGNOSIS — D225 Melanocytic nevi of trunk: Secondary | ICD-10-CM | POA: Diagnosis not present

## 2022-08-16 DIAGNOSIS — D485 Neoplasm of uncertain behavior of skin: Secondary | ICD-10-CM | POA: Diagnosis not present

## 2022-08-16 DIAGNOSIS — Z1283 Encounter for screening for malignant neoplasm of skin: Secondary | ICD-10-CM | POA: Diagnosis not present

## 2022-10-31 DIAGNOSIS — I10 Essential (primary) hypertension: Secondary | ICD-10-CM | POA: Diagnosis not present

## 2022-10-31 DIAGNOSIS — G3184 Mild cognitive impairment, so stated: Secondary | ICD-10-CM | POA: Diagnosis not present

## 2022-10-31 DIAGNOSIS — Z79899 Other long term (current) drug therapy: Secondary | ICD-10-CM | POA: Diagnosis not present

## 2022-10-31 DIAGNOSIS — Z125 Encounter for screening for malignant neoplasm of prostate: Secondary | ICD-10-CM | POA: Diagnosis not present

## 2022-10-31 DIAGNOSIS — R002 Palpitations: Secondary | ICD-10-CM | POA: Diagnosis not present

## 2022-11-07 DIAGNOSIS — I10 Essential (primary) hypertension: Secondary | ICD-10-CM | POA: Diagnosis not present

## 2022-11-07 DIAGNOSIS — E785 Hyperlipidemia, unspecified: Secondary | ICD-10-CM | POA: Diagnosis not present

## 2023-03-09 DIAGNOSIS — R634 Abnormal weight loss: Secondary | ICD-10-CM | POA: Diagnosis not present

## 2023-03-09 DIAGNOSIS — I10 Essential (primary) hypertension: Secondary | ICD-10-CM | POA: Diagnosis not present

## 2023-06-11 DIAGNOSIS — I10 Essential (primary) hypertension: Secondary | ICD-10-CM | POA: Diagnosis not present

## 2023-06-11 DIAGNOSIS — R634 Abnormal weight loss: Secondary | ICD-10-CM | POA: Diagnosis not present

## 2023-07-15 IMAGING — MR MR HEAD W/O CM
12 series · 48 of 48 positions shown · non-contrast
Comparison: No pertinent prior exams available for comparison.

CLINICAL DATA: Memory loss.

EXAM:
MRI HEAD WITHOUT CONTRAST
TECHNIQUE: Multiplanar, multiecho pulse sequences of the brain and surrounding
structures were obtained without intravenous contrast.

[Series 5: DWI · axial · 3.0mm · 0.77mm/px · z∈[-29,+118]mm · 3 of 50 slices shown (1 of 4)]
[im 1/50]
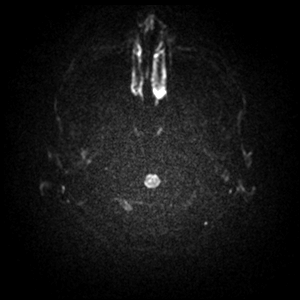
[im 25/50]
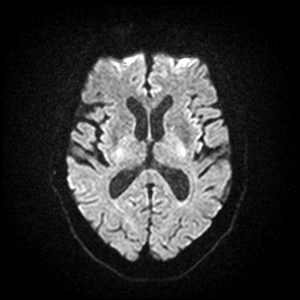
[im 50/50]
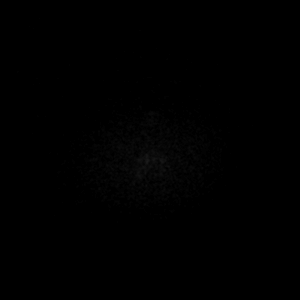

[Series 6: DWI · axial · 3.0mm · 0.77mm/px · z∈[-29,+118]mm · 4 of 50 slices shown (2 of 4)]
[im 1/50]
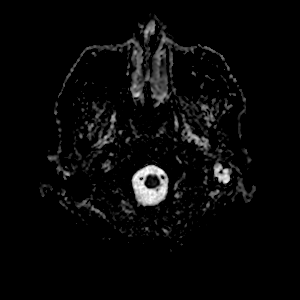
[im 17/50]
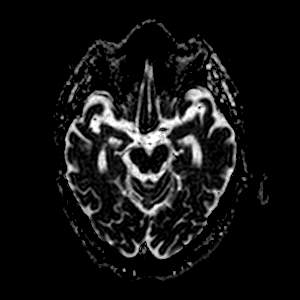
[im 33/50]
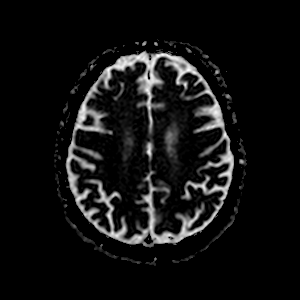
[im 50/50]
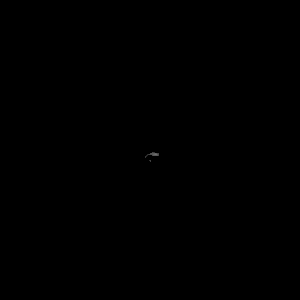

[Series 7: DWI · coronal · 5.0mm · 0.88mm/px · 2 of 28 slices shown (3 of 4)]
[im 1/28]
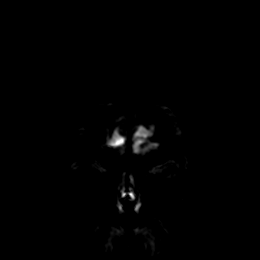
[im 28/28]
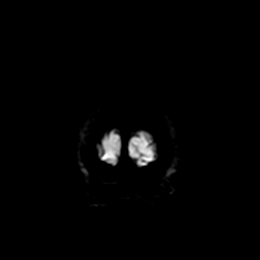

[Series 8: DWI · coronal · 5.0mm · 0.88mm/px · 2 of 28 slices shown (4 of 4)]
[im 1/28]
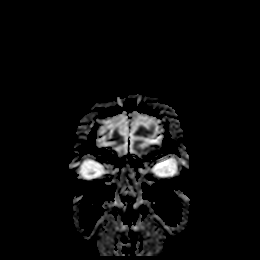
[im 28/28]
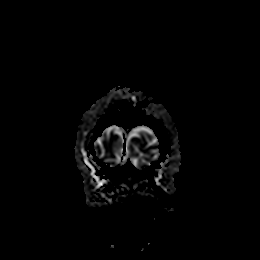

[Series 9: T1 · sagittal · 5.0mm · 0.75mm/px · 2 of 21 slices shown (1 of 2)]
[im 1/21]
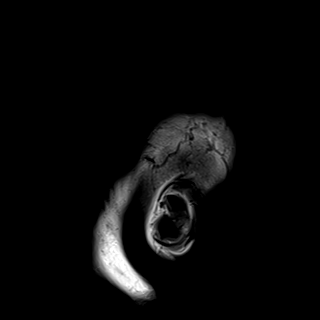
[im 21/21]
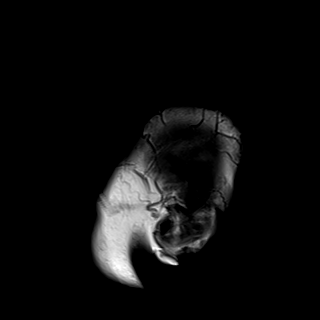

[Series 10: T2 · axial · 5.0mm · 0.72mm/px · z∈[-32,+121]mm · 2 of 23 slices shown (1 of 2)]
[im 1/23]
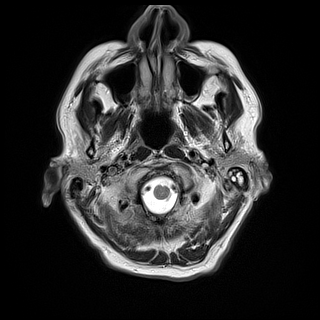
[im 23/23]
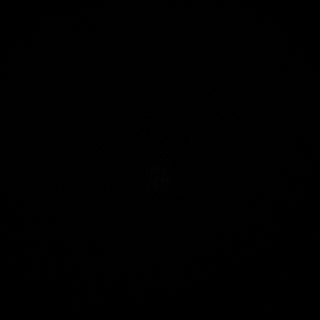

[Series 11: mag_images · axial · 3.0mm · 0.90mm/px · z∈[-45,+132]mm · 5 of 60 slices shown]
[im 1/60]
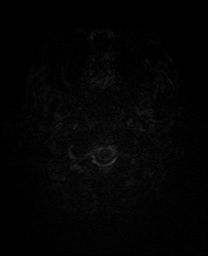
[im 15/60]
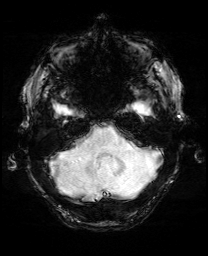
[im 30/60]
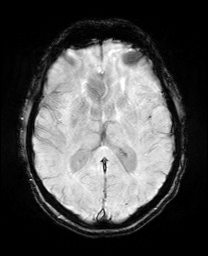
[im 45/60]
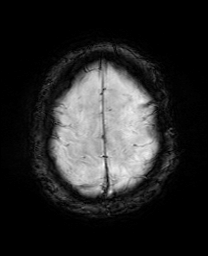
[im 60/60]
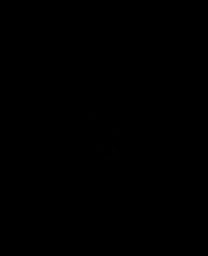

[Series 12: pha_images · axial · 3.0mm · 0.90mm/px · z∈[-45,+123]mm · 4 of 57 slices shown]
[im 1/57]
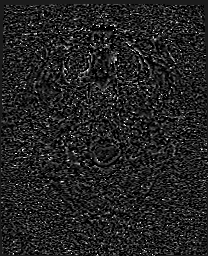
[im 19/57]
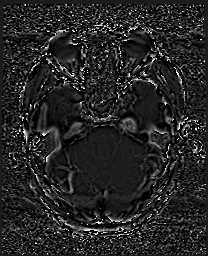
[im 38/57]
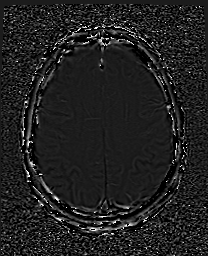
[im 57/57]
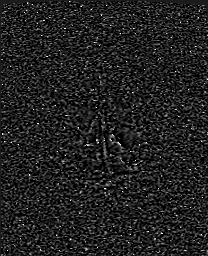

[Series 13: swi_images · axial · 3.0mm · 0.90mm/px · z∈[-45,+132]mm · 5 of 60 slices shown]
[im 1/60]
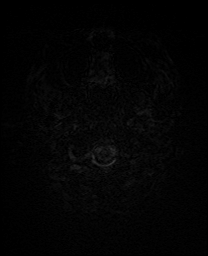
[im 15/60]
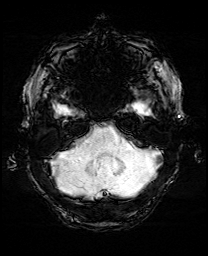
[im 30/60]
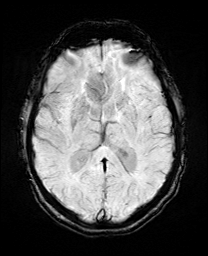
[im 45/60]
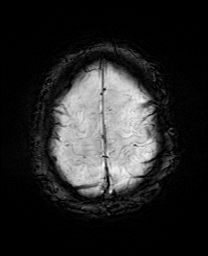
[im 60/60]
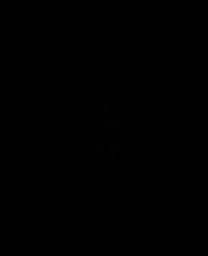

[Series 15: FLAIR · axial · 3.0mm · 0.45mm/px · z∈[-30,+117]mm · 4 of 50 slices shown]
[im 1/50]
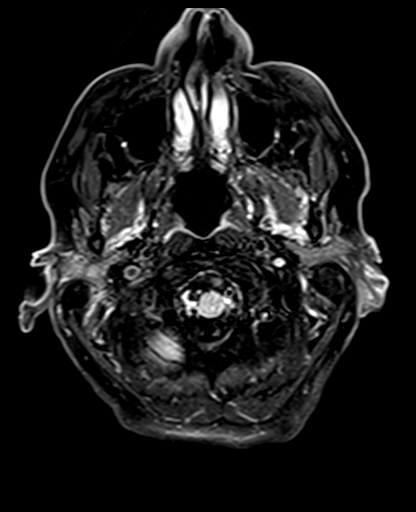
[im 17/50]
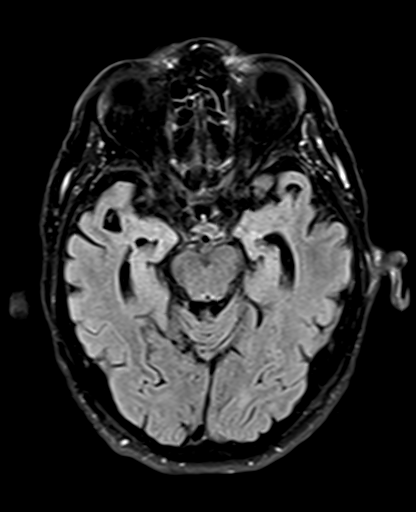
[im 33/50]
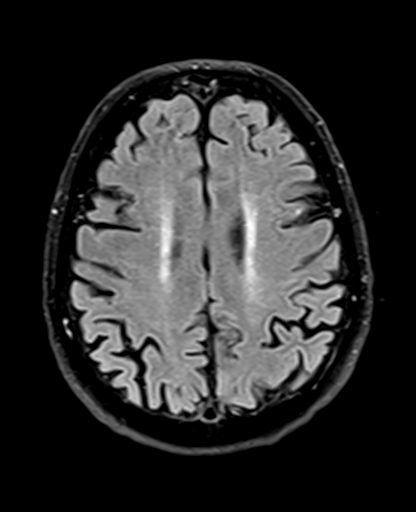
[im 50/50]
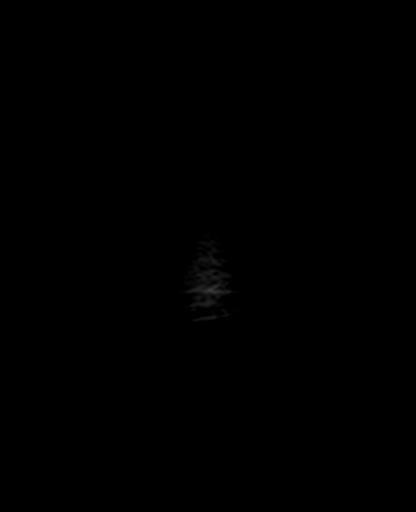

[Series 16: T1 · axial · 1.0mm · 0.98mm/px · z∈[-44,+125]mm · 13 of 169 slices shown (2 of 2)]
[im 1/169]
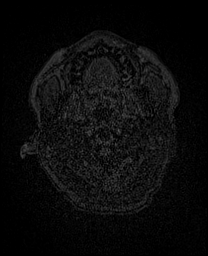
[im 15/169]
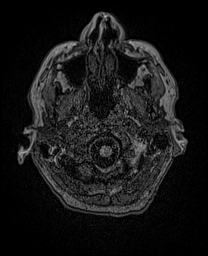
[im 29/169]
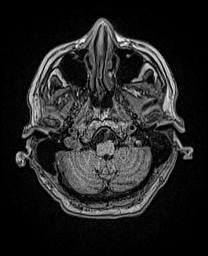
[im 43/169]
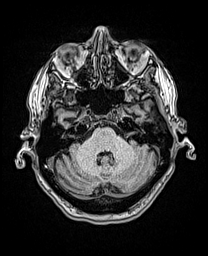
[im 57/169]
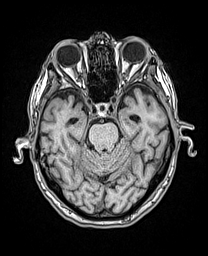
[im 71/169]
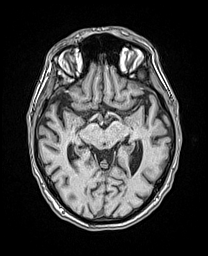
[im 85/169]
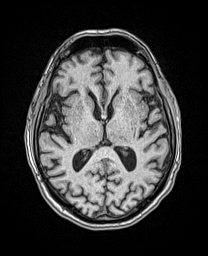
[im 99/169]
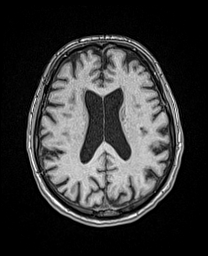
[im 113/169]
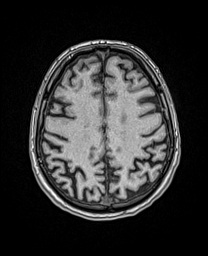
[im 127/169]
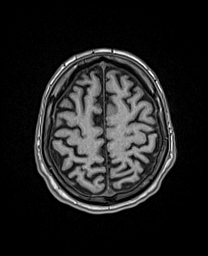
[im 141/169]
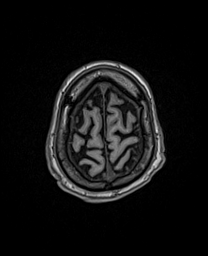
[im 155/169]
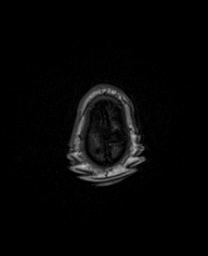
[im 169/169]
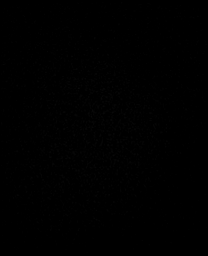

[Series 17: T2 · coronal · 5.0mm · 0.72mm/px · 2 of 28 slices shown (2 of 2)]
[im 1/28]
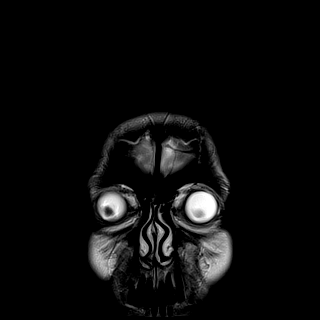
[im 28/28]
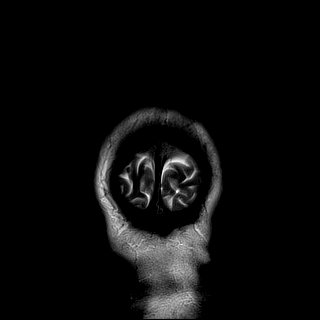

[48 of 48 positions shown; findings below may reference images not displayed]

FINDINGS: Brain:

Mild cerebral atrophy without appreciable lobar predominance. Mild
cerebellar atrophy.

Small focus of chronic encephalomalacia within the inferolateral
left temporal lobe, possibly posttraumatic in this location (series
15, image 12) (series 17, image 18).

Minimal multifocal T2 FLAIR hyperintense signal abnormality within
the cerebral white matter, nonspecific but compatible with chronic
small vessel ischemic disease.

Partially empty sella turcica, a nonspecific finding.

There is no acute infarct.

No evidence of an intracranial mass.

No chronic intracranial blood products.

No extra-axial fluid collection.

No midline shift.

Vascular: Maintained flow voids within the proximal large arterial
vessels.

Skull and upper cervical spine: No focal suspicious marrow lesion.

Sinuses/Orbits: Visualized orbits show no acute finding. No
significant paranasal sinus disease at the imaged levels.

Other: Large left mastoid effusion.
IMPRESSION: No evidence of acute intracranial abnormality.

Small focus of chronic encephalomalacia within the inferolateral
left temporal lobe, possibly posttraumatic in this location.

Minimal chronic small-vessel ischemic changes within the cerebral
white matter.

Mild generalized cerebral and cerebellar atrophy.

Large left mastoid effusion.

## 2023-09-19 DIAGNOSIS — G20A1 Parkinson's disease without dyskinesia, without mention of fluctuations: Secondary | ICD-10-CM | POA: Diagnosis not present

## 2023-09-19 DIAGNOSIS — I10 Essential (primary) hypertension: Secondary | ICD-10-CM | POA: Diagnosis not present

## 2024-03-25 ENCOUNTER — Inpatient Hospital Stay (HOSPITAL_COMMUNITY)
Admission: EM | Admit: 2024-03-25 | Discharge: 2024-04-03 | DRG: 080 | Disposition: A | Discharge status: Hospice | Attending: Internal Medicine | Admitting: Internal Medicine

## 2024-03-25 ENCOUNTER — Emergency Department (HOSPITAL_COMMUNITY)

## 2024-03-25 ENCOUNTER — Other Ambulatory Visit: Payer: Self-pay

## 2024-03-25 ENCOUNTER — Encounter (HOSPITAL_COMMUNITY): Payer: Self-pay | Admitting: Emergency Medicine

## 2024-03-25 DIAGNOSIS — G939 Disorder of brain, unspecified: Principal | ICD-10-CM | POA: Diagnosis present

## 2024-03-25 DIAGNOSIS — Z515 Encounter for palliative care: Secondary | ICD-10-CM

## 2024-03-25 DIAGNOSIS — Z66 Do not resuscitate: Secondary | ICD-10-CM | POA: Diagnosis not present

## 2024-03-25 DIAGNOSIS — R001 Bradycardia, unspecified: Secondary | ICD-10-CM | POA: Diagnosis present

## 2024-03-25 DIAGNOSIS — G936 Cerebral edema: Principal | ICD-10-CM | POA: Diagnosis present

## 2024-03-25 DIAGNOSIS — F03C0 Unspecified dementia, severe, without behavioral disturbance, psychotic disturbance, mood disturbance, and anxiety: Secondary | ICD-10-CM | POA: Diagnosis not present

## 2024-03-25 DIAGNOSIS — J690 Pneumonitis due to inhalation of food and vomit: Secondary | ICD-10-CM | POA: Diagnosis present

## 2024-03-25 DIAGNOSIS — Z79899 Other long term (current) drug therapy: Secondary | ICD-10-CM | POA: Diagnosis not present

## 2024-03-25 DIAGNOSIS — S12400A Unspecified displaced fracture of fifth cervical vertebra, initial encounter for closed fracture: Secondary | ICD-10-CM | POA: Diagnosis present

## 2024-03-25 DIAGNOSIS — R531 Weakness: Secondary | ICD-10-CM | POA: Diagnosis not present

## 2024-03-25 DIAGNOSIS — M5030 Other cervical disc degeneration, unspecified cervical region: Secondary | ICD-10-CM | POA: Diagnosis not present

## 2024-03-25 DIAGNOSIS — I10 Essential (primary) hypertension: Secondary | ICD-10-CM | POA: Diagnosis not present

## 2024-03-25 DIAGNOSIS — I700 Atherosclerosis of aorta: Secondary | ICD-10-CM | POA: Diagnosis not present

## 2024-03-25 DIAGNOSIS — S12500A Unspecified displaced fracture of sixth cervical vertebra, initial encounter for closed fracture: Secondary | ICD-10-CM | POA: Diagnosis not present

## 2024-03-25 DIAGNOSIS — R296 Repeated falls: Secondary | ICD-10-CM | POA: Diagnosis not present

## 2024-03-25 DIAGNOSIS — S2241XA Multiple fractures of ribs, right side, initial encounter for closed fracture: Secondary | ICD-10-CM | POA: Diagnosis not present

## 2024-03-25 DIAGNOSIS — G9389 Other specified disorders of brain: Principal | ICD-10-CM

## 2024-03-25 DIAGNOSIS — Z7189 Other specified counseling: Secondary | ICD-10-CM | POA: Diagnosis not present

## 2024-03-25 DIAGNOSIS — S12401A Unspecified nondisplaced fracture of fifth cervical vertebra, initial encounter for closed fracture: Secondary | ICD-10-CM

## 2024-03-25 DIAGNOSIS — Z87891 Personal history of nicotine dependence: Secondary | ICD-10-CM | POA: Diagnosis not present

## 2024-03-25 DIAGNOSIS — W010XXA Fall on same level from slipping, tripping and stumbling without subsequent striking against object, initial encounter: Secondary | ICD-10-CM | POA: Diagnosis present

## 2024-03-25 DIAGNOSIS — W19XXXA Unspecified fall, initial encounter: Secondary | ICD-10-CM

## 2024-03-25 DIAGNOSIS — S129XXA Fracture of neck, unspecified, initial encounter: Secondary | ICD-10-CM | POA: Diagnosis not present

## 2024-03-25 DIAGNOSIS — R569 Unspecified convulsions: Secondary | ICD-10-CM | POA: Diagnosis not present

## 2024-03-25 DIAGNOSIS — Z8249 Family history of ischemic heart disease and other diseases of the circulatory system: Secondary | ICD-10-CM | POA: Diagnosis not present

## 2024-03-25 DIAGNOSIS — E782 Mixed hyperlipidemia: Secondary | ICD-10-CM | POA: Insufficient documentation

## 2024-03-25 DIAGNOSIS — N400 Enlarged prostate without lower urinary tract symptoms: Secondary | ICD-10-CM | POA: Diagnosis present

## 2024-03-25 DIAGNOSIS — G9341 Metabolic encephalopathy: Secondary | ICD-10-CM | POA: Diagnosis present

## 2024-03-25 DIAGNOSIS — T796XXA Traumatic ischemia of muscle, initial encounter: Secondary | ICD-10-CM | POA: Diagnosis present

## 2024-03-25 DIAGNOSIS — M47812 Spondylosis without myelopathy or radiculopathy, cervical region: Secondary | ICD-10-CM | POA: Diagnosis not present

## 2024-03-25 DIAGNOSIS — S12491A Other nondisplaced fracture of fifth cervical vertebra, initial encounter for closed fracture: Secondary | ICD-10-CM | POA: Diagnosis not present

## 2024-03-25 DIAGNOSIS — G20A1 Parkinson's disease without dyskinesia, without mention of fluctuations: Secondary | ICD-10-CM | POA: Diagnosis not present

## 2024-03-25 DIAGNOSIS — R4189 Other symptoms and signs involving cognitive functions and awareness: Secondary | ICD-10-CM | POA: Diagnosis present

## 2024-03-25 DIAGNOSIS — Z7401 Bed confinement status: Secondary | ICD-10-CM | POA: Diagnosis not present

## 2024-03-25 DIAGNOSIS — Z1211 Encounter for screening for malignant neoplasm of colon: Secondary | ICD-10-CM | POA: Insufficient documentation

## 2024-03-25 DIAGNOSIS — D496 Neoplasm of unspecified behavior of brain: Secondary | ICD-10-CM | POA: Diagnosis not present

## 2024-03-25 DIAGNOSIS — M160 Bilateral primary osteoarthritis of hip: Secondary | ICD-10-CM | POA: Diagnosis not present

## 2024-03-25 DIAGNOSIS — I6782 Cerebral ischemia: Secondary | ICD-10-CM | POA: Diagnosis not present

## 2024-03-25 DIAGNOSIS — E7800 Pure hypercholesterolemia, unspecified: Secondary | ICD-10-CM | POA: Diagnosis present

## 2024-03-25 DIAGNOSIS — F0280 Dementia in other diseases classified elsewhere without behavioral disturbance: Secondary | ICD-10-CM | POA: Diagnosis present

## 2024-03-25 DIAGNOSIS — I771 Stricture of artery: Secondary | ICD-10-CM | POA: Diagnosis not present

## 2024-03-25 DIAGNOSIS — R9089 Other abnormal findings on diagnostic imaging of central nervous system: Secondary | ICD-10-CM | POA: Diagnosis not present

## 2024-03-25 DIAGNOSIS — S12300A Unspecified displaced fracture of fourth cervical vertebra, initial encounter for closed fracture: Secondary | ICD-10-CM | POA: Diagnosis not present

## 2024-03-25 DIAGNOSIS — J90 Pleural effusion, not elsewhere classified: Secondary | ICD-10-CM | POA: Diagnosis not present

## 2024-03-25 DIAGNOSIS — Z043 Encounter for examination and observation following other accident: Secondary | ICD-10-CM | POA: Diagnosis not present

## 2024-03-25 DIAGNOSIS — K5730 Diverticulosis of large intestine without perforation or abscess without bleeding: Secondary | ICD-10-CM | POA: Diagnosis not present

## 2024-03-25 LAB — COMPREHENSIVE METABOLIC PANEL WITH GFR
ALT: 16 U/L (ref 0–44)
AST: 35 U/L (ref 15–41)
Albumin: 3.1 g/dL — ABNORMAL LOW (ref 3.5–5.0)
Alkaline Phosphatase: 94 U/L (ref 38–126)
Anion gap: 9 (ref 5–15)
BUN: 12 mg/dL (ref 8–23)
CO2: 28 mmol/L (ref 22–32)
Calcium: 8.8 mg/dL — ABNORMAL LOW (ref 8.9–10.3)
Chloride: 100 mmol/L (ref 98–111)
Creatinine, Ser: 0.63 mg/dL (ref 0.61–1.24)
GFR, Estimated: >60 mL/min (ref >60)
Glucose, Bld: 96 mg/dL (ref 70–99)
Potassium: 3.5 mmol/L (ref 3.5–5.1)
Sodium: 137 mmol/L (ref 135–145)
Total Bilirubin: 0.9 mg/dL (ref 0.0–1.2)
Total Protein: 6 g/dL — ABNORMAL LOW (ref 6.5–8.1)

## 2024-03-25 LAB — URINALYSIS, ROUTINE W REFLEX MICROSCOPIC
Bacteria, UA: NONE SEEN
Bilirubin Urine: NEGATIVE
Glucose, UA: NEGATIVE mg/dL
Ketones, ur: NEGATIVE mg/dL
Leukocytes,Ua: NEGATIVE
Nitrite: NEGATIVE
Protein, ur: NEGATIVE mg/dL
Specific Gravity, Urine: 1.021 (ref 1.005–1.030)
pH: 6 (ref 5.0–8.0)

## 2024-03-25 LAB — CBC WITH DIFFERENTIAL/PLATELET
Abs Immature Granulocytes: 0.02 10*3/uL (ref 0.00–0.07)
Basophils Absolute: 0.1 10*3/uL (ref 0.0–0.1)
Basophils Relative: 1 %
Eosinophils Absolute: 0.1 10*3/uL (ref 0.0–0.5)
Eosinophils Relative: 1 %
HCT: 41 % (ref 39.0–52.0)
Hemoglobin: 13.7 g/dL (ref 13.0–17.0)
Immature Granulocytes: 0 %
Lymphocytes Relative: 11 %
Lymphs Abs: 1 10*3/uL (ref 0.7–4.0)
MCH: 30 pg (ref 26.0–34.0)
MCHC: 33.4 g/dL (ref 30.0–36.0)
MCV: 89.7 fL (ref 80.0–100.0)
Monocytes Absolute: 0.7 10*3/uL (ref 0.1–1.0)
Monocytes Relative: 8 %
Neutro Abs: 6.6 10*3/uL (ref 1.7–7.7)
Neutrophils Relative %: 79 %
Platelets: 353 10*3/uL (ref 150–400)
RBC: 4.57 MIL/uL (ref 4.22–5.81)
RDW: 11.9 % (ref 11.5–15.5)
WBC: 8.4 10*3/uL (ref 4.0–10.5)
nRBC: 0 % (ref 0.0–0.2)

## 2024-03-25 LAB — CK: Total CK: 1556 U/L — ABNORMAL HIGH (ref 49–397)

## 2024-03-25 LAB — CBG MONITORING, ED: Glucose-Capillary: 84 mg/dL (ref 70–99)

## 2024-03-25 MED ORDER — ESCITALOPRAM OXALATE 10 MG PO TABS
5.0000 mg | ORAL_TABLET | Freq: Every day | ORAL | Status: DC
  Administered 2024-03-26 – 2024-04-03 (×6): 5 mg via ORAL
  Filled 2024-03-25 (×8): qty 1

## 2024-03-25 MED ORDER — MIDAZOLAM HCL 2 MG/2ML IJ SOLN: 2.0000 mg | INTRAMUSCULAR | Status: DC

## 2024-03-25 MED ORDER — POLYETHYLENE GLYCOL 3350 17 G PO PACK: 17.0000 g | PACK | Freq: Every day | ORAL | Status: DC | PRN

## 2024-03-25 MED ORDER — ONDANSETRON HCL 4 MG PO TABS: 4.0000 mg | ORAL_TABLET | Freq: Four times a day (QID) | ORAL | Status: DC | PRN

## 2024-03-25 MED ORDER — DONEPEZIL HCL 10 MG PO TABS
10.0000 mg | ORAL_TABLET | Freq: Every day | ORAL | Status: DC
  Administered 2024-03-26 – 2024-04-03 (×6): 10 mg via ORAL
  Filled 2024-03-25: qty 1
  Filled 2024-03-25: qty 2
  Filled 2024-03-25 (×6): qty 1

## 2024-03-25 MED ORDER — DILTIAZEM HCL ER 180 MG PO CP24: 180.0000 mg | ORAL_CAPSULE | Freq: Every day | ORAL | Status: DC

## 2024-03-25 MED ORDER — LACTATED RINGERS IV BOLUS
1000.0000 mL | Freq: Once | INTRAVENOUS | Status: AC
  Administered 2024-03-25: 1000 mL via INTRAVENOUS

## 2024-03-25 MED ORDER — ONDANSETRON HCL 4 MG/2ML IJ SOLN: 4.0000 mg | Freq: Four times a day (QID) | INTRAMUSCULAR | Status: DC | PRN

## 2024-03-25 MED ORDER — GADOBUTROL 1 MMOL/ML IV SOLN
7.0000 mL | Freq: Once | INTRAVENOUS | Status: AC | PRN
  Administered 2024-03-25: 7 mL via INTRAVENOUS

## 2024-03-25 MED ORDER — MIRTAZAPINE 15 MG PO TABS
15.0000 mg | ORAL_TABLET | Freq: Every day | ORAL | Status: DC
  Administered 2024-03-26 – 2024-04-02 (×6): 15 mg via ORAL
  Filled 2024-03-25 (×7): qty 1

## 2024-03-25 MED ORDER — DILTIAZEM HCL ER COATED BEADS 180 MG PO CP24
180.0000 mg | ORAL_CAPSULE | Freq: Every day | ORAL | Status: DC
  Administered 2024-03-26 – 2024-03-30 (×2): 180 mg via ORAL
  Filled 2024-03-25 (×4): qty 1

## 2024-03-25 MED ORDER — METOPROLOL SUCCINATE ER 50 MG PO TB24
50.0000 mg | ORAL_TABLET | Freq: Every day | ORAL | Status: DC
  Administered 2024-03-26 – 2024-03-30 (×3): 50 mg via ORAL
  Filled 2024-03-25 (×4): qty 1

## 2024-03-25 MED ORDER — POTASSIUM CHLORIDE 20 MEQ PO PACK
40.0000 meq | PACK | Freq: Once | ORAL | Status: AC
  Administered 2024-03-30: 40 meq via ORAL
  Filled 2024-03-25: qty 2

## 2024-03-25 MED ORDER — ACETAMINOPHEN 650 MG RE SUPP: 650.0000 mg | Freq: Four times a day (QID) | RECTAL | Status: DC | PRN

## 2024-03-25 MED ORDER — MIDAZOLAM HCL 2 MG/2ML IJ SOLN
4.0000 mg | INTRAMUSCULAR | Status: AC
  Administered 2024-03-25: 4 mg via INTRAVENOUS
  Filled 2024-03-25: qty 4

## 2024-03-25 MED ORDER — ACETAMINOPHEN 325 MG PO TABS
650.0000 mg | ORAL_TABLET | Freq: Four times a day (QID) | ORAL | Status: DC | PRN
  Filled 2024-03-25: qty 2

## 2024-03-25 NOTE — Assessment & Plan Note (Signed)
 Baseline advanced Parkinson's dementia. - Resume mirtazapine, donepezil and Lexapro

## 2024-03-25 NOTE — Assessment & Plan Note (Signed)
 Acute fractures of the spinous processes of C5 and C6, with associated marrow edema.  Last fall was last night, patient was on the floor till this morning.  Mild rhabdo CK- 1556.   - 1 L bolus given.  Repeat CK in a.m.

## 2024-03-25 NOTE — Assessment & Plan Note (Addendum)
 Presenting with frequent falls, decline in the past 6 weeks.  Baseline dementia and Parkinson's.  No appreciable focal neurologic deficits, but exam is limited.  MRI brain (pls see detailed report)- Neurosurgical consultation and lumbar puncture recommended.  Edema with mass effect and partial effacement of the left temporal horn no midline shift. - EDP talked to neurosurgery on-call, admit to [facility], recommends c-collar.  Further conversations via secure chat, did not recommend steroids, requested neurology consult. - EDP consulted neurology-at this time low suspicion for infectious process, recommended lumbar puncture with analysis (see note).  Hospitalist team to reach back if any questions or need assistance. - EDP to attempt LP, if unsuccessful will need IR at [facility]. - 1 L bolus given. - N.p.o. midnight

## 2024-03-25 NOTE — Assessment & Plan Note (Signed)
 Resume metoprolol, diltiazem

## 2024-03-25 NOTE — Progress Notes (Addendum)
 Pt presented to ED after reportedly being found down by his wife. PMH notable for Parkinson's. Neurologically at baseline upon exam per wife, minimally follows commands. Workup revealing vasogenic edema and lesion to L frontotemporal area. Also w/ C5, C6 spinous process and interspinous/prevertebral edema.  Recommend C collar, transfer to [facility] for medical admission and metastatic workup. Formal consult to follow.   Lucendia Leard CAYLIN Susie Pousson, PA-C

## 2024-03-25 NOTE — ED Notes (Signed)
 Patient transported to MRI

## 2024-03-25 NOTE — Plan of Care (Signed)
 Ongoing neurology note  Called by Dr. Adan Holms at [facility] regarding this patient was found down.  Imaging revealed left frontotemporal lesion with vasogenic edema along with C5 spinous process and interspinous/prevertebral edema.  Neck findings likely secondary to trauma but brain findings are likely a mass.  No suspicion for infectious process. Recommend LP-glucose, protein, cell count, Gram stain, cultures, flow cytometry and cytology-try to get at least 12 to 15 cc of CSF if possible. If there is concern for infectious process, can start broad-spectrum antibiotic coverage but in the absence of leukocytosis or fever, would hold off on that till further testing comes back. Please reengage neurology if we can be of any assistance-most of the workup and management would be neurosurgical versus ID.  Primary team to manage and reach back out with questions as needed.  Tona Francis, MD Neurology

## 2024-03-25 NOTE — ED Notes (Signed)
 Pt attempting to get out of bed. Very confused. Trying to remove Aspen collar. Pt moved to room with better visual access from nursing station and bed alarm placed on pt's bed.

## 2024-03-25 NOTE — ED Notes (Signed)
 ED Provider at bedside.

## 2024-03-25 NOTE — H&P (Signed)
 And fell on the floor. History and Physical    Steven Gibbs:[id number] DOB: 1946-03-01 DOA: 03/25/2024  PCP: Artemisa Bile, MD   Patient coming from: Home  I have personally briefly reviewed patient's old medical records in [facility]  Chief Complaint: Fall  HPI: Steven Gibbs is a 78 y.o. male with medical history significant for parkinson dementia, HTN. Patient was brought to the ED via EMS reports of weakness and a falls.  At the time of my evaluation, patient is asleep, he arouses to touch, but due to significant dementia his wife provides the history. She reports that last night patient climbed up the stairs, got to the bedroom via wheelchair and fell on the carpet.  She was unable to get him up, and since he was comfortable she allowed him to sleep on the floor.  Attempted to get patient up this morning, but patient screamed, pain might have been coming from his upper neck.  Patient has baseline Parkinson's dementia.  At baseline sometimes is able to recognize her, sometimes answer simple questions, but he would not reports if he was uncomfortable.  She doubts he has had headaches, change in vision, focal weakness of extremity.  No vomiting.  But she reports for the past 6 weeks, patient has declined, and has had multiple falls. Yesterday he fell twice.  ED Course: Temperature 98.6.  Heart rate 58-61.  Respiratory rate 16-20.  O2 sats greater 97% on room air. CK 1556. MRI W/Wo-(see detailed report)-acute findings concerning for primary CNS lesion neoplasm, differentials metastatic disease, limbic/autoimmune encephalitis, herpes encephalitis among other etiologies.  Neurosurgical consultation and lumbar puncture recommended.  Edema with mass effect and partial effacement of the left temporal horn no midline shift. MRI cervical spine-acute fractures of spinous process of C5 and C6. 1 L bolus given. EDP talked to neurosurgery on-call, admit to [facility], recommends c-collar.  Further  conversations via secure chat, did not recommend steroids, requested neurology consult.  Review of Systems: As per HPI all other systems reviewed and negative.  Past Medical History:  Diagnosis Date   High cholesterol    Hypertension    Memory loss     Past Surgical History:  Procedure Laterality Date   BACK SURGERY     ORIF ANKLE FRACTURE  07/11/2012   Procedure: OPEN REDUCTION INTERNAL FIXATION (ORIF) ANKLE FRACTURE;  Surgeon: Amada Backer, MD;  Location: [facility];  Service: Orthopedics;  Laterality: Right;  Lateral malleous /  syndesmosis disruption      reports that he quit smoking about 41 years ago. His smoking use included cigarettes. He does not have any smokeless tobacco history on file. He reports current alcohol use. He reports that he does not use drugs.  No Known Allergies  Family History  Problem Relation Age of Onset   Other Mother        complications from hip replacment surgery - blood clot   Heart failure Father    Prior to Admission medications   Medication Sig Start Date End Date Taking? Authorizing Provider  atorvastatin (LIPITOR) 10 MG tablet Take 10 mg by mouth daily.   Yes [provider]  Cyanocobalamin  (B-12 PO) Take 1,000 mcg by mouth daily.   Yes [provider]  diltiazem (DILACOR XR) 180 MG 24 hr capsule Take 180 mg by mouth daily.   Yes [provider]  escitalopram (LEXAPRO) 10 MG tablet Take 5 mg by mouth daily. 02/16/24  Yes [provider]  metoprolol succinate (TOPROL-XL) 50 MG 24 hr tablet Take 50 mg by mouth daily. Take with or immediately following a meal.   Yes [provider]  mirtazapine (REMERON) 15 MG tablet Take 15 mg by mouth at bedtime. 02/04/24  Yes [provider]  thiamine (VITAMIN B-1) 100 MG tablet Take 100 mg by mouth daily.   Yes [provider]  donepezil (ARICEPT) 10 MG tablet Take 10 mg by mouth daily. 11/22/23   [provider]    Physical  Exam: Mental exam due to baseline dementia. Vitals:   03/25/24 1700 03/25/24 1701 03/25/24 1702 03/25/24 1909  BP:    (!) 151/81  Pulse: (!) 58 60 (!) 59 (!) 58  Resp: 15 18 20 18   Temp:      TempSrc:      SpO2: 98% 100% 99% 97%  Weight:      Height:        Constitutional: Sleeping but arousable, not answering questions, calm, comfortable Vitals:   03/25/24 1700 03/25/24 1701 03/25/24 1702 03/25/24 1909  BP:    (!) 151/81  Pulse: (!) 58 60 (!) 59 (!) 58  Resp: 15 18 20 18   Temp:      TempSrc:      SpO2: 98% 100% 99% 97%  Weight:      Height:       Eyes: PERRL, lids and conjunctivae normal ENMT: Rigid cervical collar in place, mucous membranes are moist. Neck: normal, supple, no masses, no thyromegaly Respiratory: clear to auscultation bilaterally, no wheezing, no crackles. Normal respiratory effort. No accessory muscle use.  Cardiovascular: Regular rate and rhythm, no murmurs / rubs / gallops. No extremity edema.   Extremities warm. Abdomen: no tenderness, no masses palpated. No hepatosplenomegaly.  Musculoskeletal: no clubbing / cyanosis. No joint deformity upper and lower extremities. Skin: no rashes, lesions, ulcers. No induration Neurologic: Limited exam due to baseline dementia, even when aroused patient not following directions. Psychiatric: Sleeping but arousable.  Limited exam.  Labs on Admission: I have personally reviewed following labs and imaging studies  CBC: Recent Labs  Lab 03/25/24 1539  WBC 8.4  NEUTROABS 6.6  HGB 13.7  HCT 41.0  MCV 89.7  PLT 353   Basic Metabolic Panel: Recent Labs  Lab 03/25/24 1539  NA 137  K 3.5  CL 100  CO2 28  GLUCOSE 96  BUN 12  CREATININE 0.63  CALCIUM 8.8*   GFR: Estimated Creatinine Clearance: 77.2 mL/min (by C-G formula based on SCr of 0.63 mg/dL). Liver Function Tests: Recent Labs  Lab 03/25/24 1539  AST 35  ALT 16  ALKPHOS 94  BILITOT 0.9  PROT 6.0*  ALBUMIN 3.1*   Cardiac Enzymes: Recent Labs   Lab 03/25/24 1539  CKTOTAL 1,556*   CBG: Recent Labs  Lab 03/25/24 1521  GLUCAP 84   Urine analysis:    Component Value Date/Time   COLORURINE YELLOW 03/25/2024 1618   APPEARANCEUR CLEAR 03/25/2024 1618   LABSPEC 1.021 03/25/2024 1618   PHURINE 6.0 03/25/2024 1618   GLUCOSEU NEGATIVE 03/25/2024 1618   HGBUR SMALL (A) 03/25/2024 1618   BILIRUBINUR NEGATIVE 03/25/2024 1618   KETONESUR NEGATIVE 03/25/2024 1618   PROTEINUR NEGATIVE 03/25/2024 1618   NITRITE NEGATIVE 03/25/2024 1618   LEUKOCYTESUR NEGATIVE 03/25/2024 1618    Radiological Exams on Admission: MR Brain W and Wo Contrast Result Date: 03/25/2024 CLINICAL DATA:  Cerebral edema on CT, recent fall. EXAM: MRI HEAD WITHOUT AND WITH CONTRAST TECHNIQUE: Multiplanar, multiecho pulse sequences  of the brain and surrounding structures were obtained without and with intravenous contrast. CONTRAST:  7mL GADAVIST GADOBUTROL 1 MMOL/ML IV SOLN COMPARISON:  Earlier same day head CT. FINDINGS: Brain: 1.1 x 1.0 x 1.3 cm nodular enhancement in the left basal ganglia involving the caudate nucleus with surrounding edema throughout the basal ganglia extending into the left temporal white matter. There is an additional focus of nodular enhancement in the left external capsule measures 1.2 x 1.0 x 1.5 cm. There is additional gyral enhancement within the anterior and medial left temporal lobe, irregular and gyral enhancement in the inferolateral left frontal lobe, and gyral enhancement along the inferior aspect of the left frontal lobe. There is edema throughout the left temporal lobe with associated mass effect on the left temporal horn. Additional edema noted in the ventral aspect of both thalami. No significant midline shift. Enhancement also noted extending into the left hypothalamus extending into the tuber cinereum with small focus of enhancement extending into the right hypothalamus observed on coronal series 15 image 14. No acute infarct. No  susceptibility weighted images due to patient discomfort. T2/FLAIR hyperintensity in the periventricular and subcortical white matter likely reflecting chronic microvascular ischemic changes. Posterior fossa is unremarkable. The basilar cisterns are patent. No extra-axial fluid collections. Ventricles: Prominence of the lateral ventricles suggestive of underlying parenchymal volume loss. Partial effacement of the left temporal horn. Vascular: Skull base flow voids are visualized. Skull and upper cervical spine: No focal abnormality. Sinuses/Orbits: Orbits are symmetric. Mucosal thickening in the left ethmoid sinus. Other: Small left mastoid effusion. IMPRESSION: Nodular enhancement in the left basal ganglia and left external capsule as well as areas of gyral enhancement in the anterior left temporal lobe and inferior left frontal lobe. Additional enhancement extending into the left hypothalamus involving the tuber cinereum with small focus of enhancement extending into the right hypothalamus. 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 Additional considerations include metastatic disease, limbic/autoimmune encephalitis, herpes encephalitis among other etiologies. Recommend neurosurgical consultation and correlation with lumbar puncture. Edema throughout the left temporal lobe extending into the external capsule and basal ganglia with associated mass effect and partial effacement of the left temporal horn. No midline shift. No evidence of infarct. Mild chronic microvascular ischemic changes and parenchymal volume loss. Electronically Signed   By: Denny Flack M.D.   On: 03/25/2024 18:56   MR Cervical Spine Wo Contrast Result Date: 03/25/2024 CLINICAL DATA:  Cervical spine fracture EXAM: MRI CERVICAL SPINE WITHOUT CONTRAST TECHNIQUE: Multiplanar, multisequence MR imaging of the cervical spine was performed. No intravenous contrast was administered. COMPARISON:  03/25/2024 CT scan FINDINGS: Alignment: No  vertebral subluxation is observed. Vertebrae: Acute fracture of the left posterior margin of the C5 spinous process with associated marrow edema, the fractures shown on image 17 series 12. Acute fracture of the spinous process of C6 centered about 1 cm from the posterior tip as shown on 22 series 12, with associated local marrow edema. Edema tracks along the adjacent interspinous ligaments especially at C5-6. Edema also extends along the interspinous ligament at C6-7. Facet edema on the left at C3-4 is likely degenerative on image 15 series 11. Severe degenerative findings are present at this level. Fused left facet joint at C4-5. Mild degenerative endosteal edema along the right facet joint at C3-4. Type 1 degenerative endplate findings at C6-7. Disc desiccation throughout the cervical spine with loss of disc height most notable at C5-6 and C6-7. Cord: No significant abnormal spinal cord signal is observed. Posterior Fossa, vertebral arteries,  paraspinal tissues: As noted above, there is edema along the interspinous ligaments at C5-6 and C6-7 potentially reflecting ligamentous injury. There is mild but abnormal edema tracking along the anterior longitudinal ligament extending from about C3 through C6. Disc levels: C2-3: No impingement.  Right uncinate and facet spurring noted. C3-4: Moderate to prominent bilateral foraminal stenosis due to disc bulge, uncinate spurring, and facet arthropathy. C4-5: No impingement. Fused and somewhat degenerated left facet joint. C5-6: Mild to moderate left foraminal stenosis due to uncinate spurring and mild facet arthropathy. C6-7: Moderate bilateral foraminal stenosis and mild central narrowing of the thecal sac due to disc bulge, uncinate spurring, and facet arthropathy. C7-T1: Unremarkable IMPRESSION: 1. Acute fractures of the spinous processes of C5 and C6, with associated marrow edema. 2. Edema tracks along the interspinous ligaments at C5-6 and C6-7, potentially reflecting  ligamentous injury. 3. Mild but abnormal edema tracking along the anterior longitudinal ligament extending from about C3 through C6, potentially reflecting ligamentous injury. 4. Cervical spondylosis and degenerative disc disease, causing moderate to prominent impingement at C3-4; moderate impingement at C6-7; and mild to moderate impingement at C5-6. Electronically Signed   By: Freida Jes M.D.   On: 03/25/2024 18:41   CT HEAD WO CONTRAST Result Date: 03/25/2024 CLINICAL DATA:  Head trauma, multiple falls, weakness while walking up stairs. EXAM: CT HEAD WITHOUT CONTRAST CT CERVICAL SPINE WITHOUT CONTRAST TECHNIQUE: Multidetector CT imaging of the head and cervical spine was performed following the standard protocol without intravenous contrast. Multiplanar CT image reconstructions of the cervical spine were also generated. RADIATION DOSE REDUCTION: This exam was performed according to the departmental dose-optimization program which includes automated exposure control, adjustment of the mA and/or kV according to patient size and/or use of iterative reconstruction technique. COMPARISON:  MRI head 09/23/2021. FINDINGS: CT HEAD FINDINGS Brain: No acute intracranial hemorrhage. Hypoattenuation in the left temporal lobe without evidence of volume loss concerning for vasogenic edema. No significant midline shift. Mild parenchymal volume loss. The basilar cisterns are patent. Ventricles: There is mild mass effect on the left temporal horn. No hydrocephalus. Vascular: No hyperdense vessel or unexpected calcification. Skull: No acute or aggressive finding. Orbits: Orbits are symmetric. Sinuses: Mucosal thickening in the ethmoid sinuses. Other: Trace fluid in the left mastoid tip. CT CERVICAL SPINE FINDINGS Alignment: Straightening of the normal cervical lordosis. No significant listhesis. No facet subluxation or dislocation. Skull base and vertebrae: Mildly displaced and angulated fracture of C6 spinous process.  There is an additional fracture of the C4 spinous process with well corticated margins. No compression fracture. No suspicious osseous lesion. Soft tissues and spinal canal: No prevertebral fluid or swelling. No visible canal hematoma. Disc levels: Moderate disc space narrowing at C5-6 and C6-7. Disc osteophyte complexes at multiple levels. No high-grade osseous spinal canal stenosis. Facet arthrosis at multiple levels foraminal narrowing is most pronounced at C3-4 and on the right at C6-7. Upper chest: Negative. Other: None. IMPRESSION: Vasogenic edema in the left temporal lobe with mild mass effect on the left temporal horn. Recommend MRI head with without contrast for further evaluation. No midline shift. Displaced and slightly angulated fracture of the C6 spinous process and additional more chronic appearing fracture of the C4 spinous process. Recommend correlation with tenderness at these levels and consider MRI cervical spine for further evaluation. Chronic and degenerative changes as above. Electronically Signed   By: Denny Flack M.D.   On: 03/25/2024 16:21   CT CERVICAL SPINE WO CONTRAST Result Date: 03/25/2024 CLINICAL DATA:  Head trauma, multiple falls, weakness while walking up stairs. EXAM: CT HEAD WITHOUT CONTRAST CT CERVICAL SPINE WITHOUT CONTRAST TECHNIQUE: Multidetector CT imaging of the head and cervical spine was performed following the standard protocol without intravenous contrast. Multiplanar CT image reconstructions of the cervical spine were also generated. RADIATION DOSE REDUCTION: This exam was performed according to the departmental dose-optimization program which includes automated exposure control, adjustment of the mA and/or kV according to patient size and/or use of iterative reconstruction technique. COMPARISON:  MRI head 09/23/2021. FINDINGS: CT HEAD FINDINGS Brain: No acute intracranial hemorrhage. Hypoattenuation in the left temporal lobe without evidence of volume loss  concerning for vasogenic edema. No significant midline shift. Mild parenchymal volume loss. The basilar cisterns are patent. Ventricles: There is mild mass effect on the left temporal horn. No hydrocephalus. Vascular: No hyperdense vessel or unexpected calcification. Skull: No acute or aggressive finding. Orbits: Orbits are symmetric. Sinuses: Mucosal thickening in the ethmoid sinuses. Other: Trace fluid in the left mastoid tip. CT CERVICAL SPINE FINDINGS Alignment: Straightening of the normal cervical lordosis. No significant listhesis. No facet subluxation or dislocation. Skull base and vertebrae: Mildly displaced and angulated fracture of C6 spinous process. There is an additional fracture of the C4 spinous process with well corticated margins. No compression fracture. No suspicious osseous lesion. Soft tissues and spinal canal: No prevertebral fluid or swelling. No visible canal hematoma. Disc levels: Moderate disc space narrowing at C5-6 and C6-7. Disc osteophyte complexes at multiple levels. No high-grade osseous spinal canal stenosis. Facet arthrosis at multiple levels foraminal narrowing is most pronounced at C3-4 and on the right at C6-7. Upper chest: Negative. Other: None. IMPRESSION: Vasogenic edema in the left temporal lobe with mild mass effect on the left temporal horn. Recommend MRI head with without contrast for further evaluation. No midline shift. Displaced and slightly angulated fracture of the C6 spinous process and additional more chronic appearing fracture of the C4 spinous process. Recommend correlation with tenderness at these levels and consider MRI cervical spine for further evaluation. Chronic and degenerative changes as above. Electronically Signed   By: Denny Flack M.D.   On: 03/25/2024 16:21   DG Chest Port 1 View Result Date: 03/25/2024 CLINICAL DATA:  weak EXAM: PORTABLE CHEST - 1 VIEW COMPARISON:  Mar 15, 2021 FINDINGS: No focal airspace consolidation or pleural effusion. No  cardiomegaly. Tortuous aorta with aortic atherosclerosis. Multilevel thoracic osteophytosis. Apparent lucency overlying the lateral left seventh rib. IMPRESSION: 1. Apparent lucency overlying the lateral left seventh rib, likely artifactual from overlapping ribs and material external to the patient. No displaced rib fractures or visualized pneumothoraces. 2. No pneumonia or pulmonary edema. Electronically Signed   By: Rance Burrows M.D.   On: 03/25/2024 16:03   DG Pelvis Portable Result Date: 03/25/2024 CLINICAL DATA:  Fall.  Weakness. EXAM: PORTABLE PELVIS 1-2 VIEWS COMPARISON:  None CT pelvis 03/07/2006 FINDINGS: Mild bilateral sacroiliac subchondral sclerosis. Moderate bilateral superior femoroacetabular joint space narrowing. Small chronic ossicles at the superolateral aspect of each femoroacetabular joint. The pubic symphysis joint space is maintained. No acute fracture is seen. No dislocation. IMPRESSION: 1. Moderate bilateral femoroacetabular osteoarthritis. 2. No acute fracture is seen. Electronically Signed   By: Bertina Broccoli M.D.   On: 03/25/2024 16:03   EKG: None.   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  Baseline dementia and Parkinson's.  No appreciable focal neurologic deficits, but exam is limited.  MRI brain (pls see detailed report)- Neurosurgical consultation and lumbar puncture recommended.  Edema with mass effect and partial effacement of the left temporal horn no midline shift. - EDP talked to neurosurgery on-call, admit to [facility], recommends c-collar.  Further conversations via secure chat, did not recommend steroids, requested neurology consult. - EDP consulted neurology-at this time low suspicion for  infectious process, recommended lumbar puncture with analysis (see note).  Hospitalist team to reach back if any questions or need assistance. - EDP to attempt LP, if unsuccessful will need IR at [facility]. - 1 L bolus given. - N.p.o. midnight  Essential hypertension Resume metoprolol, diltiazem  Fracture of cervical spinous process, initial encounter (HCC) Acute fractures of the spinous processes of C5 and C6, with associated marrow edema.  Last fall was last night, patient was on the floor till this morning.  Mild rhabdo CK- 1556.   - 1 L bolus given.  Repeat CK in a.m.  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 - Resume mirtazapine, donepezil and Lexapro    DVT prophylaxis: SCDS Code Status: FULL code-confirmed with spouse at bedside Family Communication: Spouse at bedside Disposition Plan: ~ /> 2 days Consults called: Neurosugery Admission status: Inpt tele I certify that at the point of admission it is my clinical judgment that the patient will require inpatient hospital care spanning beyond 2 midnights from the point of admission due to high intensity of service, high risk for further deterioration and high frequency of surveillance required.   Author: Pati Bonine, MD 03/25/2024 11:42 PM  For on call review [email or web address].

## 2024-03-25 NOTE — Assessment & Plan Note (Deleted)
 Acute fractures of the spinous processes of C5 and C6, with associated marrow edema.

## 2024-03-25 NOTE — ED Triage Notes (Signed)
 Pt has been having multiple falls but last night walked up stairs and became weak and sit down. Wife could not get pt up so she let him sleep in the floor. When help came today to get pt up he screamed in pain so they call 911. Ems states pt denied any pain when getting him up. Pt has parkinsons and dementia.

## 2024-03-25 NOTE — ED Provider Notes (Signed)
 [place] [facility] AT [facility] Provider Note   CSN: [id number] Arrival date & time: 03/25/24  1446     History {Add pertinent medical, surgical, social history, OB history to HPI:1} Chief Complaint  Patient presents with   Steven Gibbs is a 78 y.o. male.  78 year old male with history of Parkinson's and dementia who presents emergency department after a fall.  History obtained prominently from EMS.  Patient has had multiple falls recently and last night walked up the stairs and became weak and sat down.  Wife cannot get him up so she let him sleep on the floor.  When they went to get him up today he is screaming in pain so 911 was called.  Patient denies any pain to me at this point in time.  Unable to reach his wife for additional information despite calling.       Home Medications Prior to Admission medications   Medication Sig Start Date End Date Taking? Authorizing Provider  atorvastatin (LIPITOR) 10 MG tablet Take 10 mg by mouth daily.    [provider]  Cyanocobalamin  (B-12 PO) Take 1,000 mcg by mouth daily.    [provider]  diltiazem (DILACOR XR) 180 MG 24 hr capsule Take 180 mg by mouth daily.    [provider]  metoprolol succinate (TOPROL-XL) 50 MG 24 hr tablet Take 50 mg by mouth daily. Take with or immediately following a meal.    [provider]      Allergies    Patient has no known allergies.    Review of Systems   Review of Systems  Physical Exam Updated Vital Signs BP 126/72   Pulse 61   Temp 98.6 F (37 C) (Axillary)   Resp 16   Ht 5\' 8"  (1.727 m)   Wt 69 kg   SpO2 99%   BMI 23.13 kg/m  Physical Exam Constitutional:      General: He is not in acute distress.    Appearance: Normal appearance. He is not ill-appearing.  HENT:     Head: Normocephalic and atraumatic.     Right Ear: External ear normal.     Left Ear: External ear normal.     Mouth/Throat:     Mouth:  Mucous membranes are moist.     Pharynx: Oropharynx is clear.  Eyes:     Extraocular Movements: Extraocular movements intact.     Conjunctiva/sclera: Conjunctivae normal.     Pupils: Pupils are equal, round, and reactive to light.     Comments: Pupils 2mm BL  Neck:     Comments: No C-spine midline tenderness to palpation Cardiovascular:     Rate and Rhythm: Normal rate and regular rhythm.     Pulses: Normal pulses.     Heart sounds: Normal heart sounds.  Pulmonary:     Effort: Pulmonary effort is normal. No respiratory distress.     Breath sounds: Normal breath sounds.  Abdominal:     General: Abdomen is flat. There is no distension.     Palpations: Abdomen is soft. There is no mass.     Tenderness: There is no abdominal tenderness. There is no guarding.  Musculoskeletal:        General: No deformity. Normal range of motion.     Cervical back: No rigidity or tenderness.     Comments: No tenderness to palpation of midline thoracic or lumbar spine.  No step-offs palpated.  No tenderness to palpation  of chest wall.  No bruising noted.  No tenderness to palpation of bilateral clavicles.  No tenderness to palpation, bruising, or deformities noted of bilateral shoulders, elbows, wrists, hips, knees, or ankles.  Neurological:     Mental Status: He is alert.     Comments: Unclear mental baseline. Will occasionally answer yes and no to questions. No gross CN deficits. No focal weakness of upper or lower extremities.      ED Results / Procedures / Treatments   Labs (all labs ordered are listed, but only abnormal results are displayed) Labs Reviewed  COMPREHENSIVE METABOLIC PANEL WITH GFR  CBC WITH DIFFERENTIAL/PLATELET  URINALYSIS, ROUTINE W REFLEX MICROSCOPIC  CK  CBG MONITORING, ED    EKG None  Radiology No results found.  Procedures Procedures  {Document cardiac monitor, telemetry assessment procedure when appropriate:1}  Medications Ordered in ED Medications - No  data to display  ED Course/ Medical Decision Making/ A&P   {   Click here for ABCD2, HEART and other calculatorsREFRESH Note before signing :1}                              Medical Decision Making Amount and/or Complexity of Data Reviewed Labs: ordered. Radiology: ordered.   ***  {Document critical care time when appropriate:1} {Document review of labs and clinical decision tools ie heart score, Chads2Vasc2 etc:1}  {Document your independent review of radiology images, and any outside records:1} {Document your discussion with family members, caretakers, and with consultants:1} {Document social determinants of health affecting pt's care:1} {Document your decision making why or why not admission, treatments were needed:1} Final Clinical Impression(s) / ED Diagnoses Final diagnoses:  None    Rx / DC Orders ED Discharge Orders     None

## 2024-03-26 ENCOUNTER — Inpatient Hospital Stay (HOSPITAL_COMMUNITY)

## 2024-03-26 DIAGNOSIS — G939 Disorder of brain, unspecified: Secondary | ICD-10-CM | POA: Diagnosis not present

## 2024-03-26 LAB — CSF CELL COUNT WITH DIFFERENTIAL
Eosinophils, CSF: 0 % (ref 0–1)
Eosinophils, CSF: 0 % (ref 0–1)
Lymphs, CSF: 78 % (ref 40–80)
Lymphs, CSF: 85 % — ABNORMAL HIGH (ref 40–80)
Monocyte-Macrophage-Spinal Fluid: 12 % — ABNORMAL LOW (ref 15–45)
Monocyte-Macrophage-Spinal Fluid: 6 % — ABNORMAL LOW (ref 15–45)
RBC Count, CSF: 16 /mm3 — ABNORMAL HIGH
RBC Count, CSF: 210 /mm3 — ABNORMAL HIGH
Segmented Neutrophils-CSF: 16 % — ABNORMAL HIGH (ref 0–6)
Segmented Neutrophils-CSF: 3 % (ref 0–6)
Tube #: 1
Tube #: 4
WBC, CSF: 1 /mm3 (ref 0–5)
WBC, CSF: 2 /mm3 (ref 0–5)

## 2024-03-26 LAB — CBC
HCT: 40.2 % (ref 39.0–52.0)
Hemoglobin: 13.1 g/dL (ref 13.0–17.0)
MCH: 28.9 pg (ref 26.0–34.0)
MCHC: 32.6 g/dL (ref 30.0–36.0)
MCV: 88.5 fL (ref 80.0–100.0)
Platelets: 293 10*3/uL (ref 150–400)
RBC: 4.54 MIL/uL (ref 4.22–5.81)
RDW: 11.8 % (ref 11.5–15.5)
WBC: 10.1 10*3/uL (ref 4.0–10.5)
nRBC: 0 % (ref 0.0–0.2)

## 2024-03-26 LAB — BASIC METABOLIC PANEL WITH GFR
Anion gap: 10 (ref 5–15)
BUN: 10 mg/dL (ref 8–23)
CO2: 27 mmol/L (ref 22–32)
Calcium: 8.7 mg/dL — ABNORMAL LOW (ref 8.9–10.3)
Chloride: 100 mmol/L (ref 98–111)
Creatinine, Ser: 0.66 mg/dL (ref 0.61–1.24)
GFR, Estimated: >60 mL/min (ref >60)
Glucose, Bld: 96 mg/dL (ref 70–99)
Potassium: 3.8 mmol/L (ref 3.5–5.1)
Sodium: 137 mmol/L (ref 135–145)

## 2024-03-26 LAB — MENINGITIS/ENCEPHALITIS PANEL (CSF)

## 2024-03-26 LAB — GLUCOSE, CAPILLARY
Glucose-Capillary: 81 mg/dL (ref 70–99)
Glucose-Capillary: 70 mg/dL (ref 70–99)

## 2024-03-26 LAB — PROTEIN AND GLUCOSE, CSF
Glucose, CSF: 56 mg/dL (ref 40–70)
Total  Protein, CSF: 76 mg/dL — ABNORMAL HIGH (ref 15–45)

## 2024-03-26 MED ORDER — IOHEXOL 350 MG/ML SOLN
60.0000 mL | Freq: Once | INTRAVENOUS | Status: AC | PRN
  Administered 2024-03-26: 60 mL via INTRAVENOUS

## 2024-03-26 MED ORDER — LACTATED RINGERS IV SOLN: INTRAVENOUS | Status: AC

## 2024-03-26 NOTE — ED Notes (Signed)
 Pt keeps attempting to take SpO2 device off of finger, some difficulty in redirecting to keep SpO2 monitor on

## 2024-03-26 NOTE — Progress Notes (Signed)
   03/26/24 1254  TOC Brief Assessment  Insurance and Status Reviewed  Patient has primary care physician Yes  Home environment has been reviewed Home  Prior level of function: spouse  Prior/Current Home Services No current home services  Social Drivers of Health Review SDOH reviewed no interventions necessary  Readmission risk has been reviewed Yes  Transition of care needs no transition of care needs at this time   Transfer to [facility] Transition of Care Department [facility]) has reviewed patient and no TOC needs have been identified at this time. We will continue to monitor patient advancement through interdisciplinary progression rounds. If new patient transition needs arise, please place a TOC consult

## 2024-03-26 NOTE — Progress Notes (Signed)
 Paged admitting services.

## 2024-03-26 NOTE — Progress Notes (Signed)
 PROGRESS NOTE    Steven Gibbs  WUJ:[id number] DOB: 1946/01/04 DOA: 03/25/2024 PCP: Artemisa Bile, MD   Brief Narrative:    Steven Gibbs is a 78 y.o. male with medical history significant for parkinson dementia, HTN. Patient was brought to the ED via EMS reports of weakness and falls.  He had brain MRI demonstrating a brain lesion concerning for primary neoplasm and neurosurgical consultation as well as lumbar puncture was recommended.  Lumbar puncture performed by EDP on 6/10 with no concerning findings noted thus far and CSF.  He is currently in c-collar and is awaiting transfer to [facility] for further evaluation by neurosurgery.  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  Baseline dementia and Parkinson's.  No appreciable focal neurologic deficits, but exam is limited.  MRI brain (pls see detailed report)- Neurosurgical consultation and lumbar puncture recommended.  Edema with mass effect and partial effacement of the left temporal horn no midline shift. - EDP talked to neurosurgery on-call, admit to [facility], recommends c-collar.  Further conversations via secure chat, did not recommend steroids, requested neurology consult. - EDP consulted neurology-at this time low suspicion for infectious process, recommended lumbar puncture with analysis (see note) which was performed by EDP on 6/11.  No concerning findings noted and cultures currently pending -Awaiting transfer to [facility] and is currently n.p.o., start IV fluid   Essential hypertension-controlled Resume metoprolol, diltiazem   Fracture of cervical spinous process, initial encounter (HCC) Acute fractures of the spinous processes of C5 and C6, with associated marrow  edema.  Last fall was last night, patient was on the floor till this morning.  Mild rhabdo CK- 1556.   - 1 L bolus given.  Repeat CK in a.m.   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 - Resume mirtazapine, donepezil and Lexapro    DVT prophylaxis: SCDs Code Status: Full Family Communication: None at bedside Disposition Plan: Transfer to [facility] for neurosurgery evaluation Status is: Inpatient Remains inpatient appropriate because: Need for IV fluids and medications.   Consultants:  Case discussed with neurosurgery and neurology  Procedures:  LP on 6/11 in ED  Antimicrobials:  None   Subjective: Patient seen and evaluated today and is mostly somnolent and mumbling incoherently.  He does become more responsive with tactile stimulation.  No acute overnight events noted.  Objective: Vitals:   03/26/24 0400 03/26/24 0500 03/26/24 0600 03/26/24 0837  BP: 137/75 (!) 142/86 129/70   Pulse: 60 62 64   Resp: 16 19 17    Temp:    98.2 F (36.8 C)  TempSrc:    Axillary  SpO2: 94% 98% 97%   Weight:      Height:        Intake/Output Summary (Last 24 hours) at 03/26/2024 0839 Last data filed at 03/25/2024 2128 Gross per 24 hour  Intake 2000 ml  Output --  Net 2000 ml   Filed Weights   03/25/24 1514  Weight: 69 kg    Examination:  General exam: Appears somnolent and poorly responsive, mumbles incoherently, c-collar applied Respiratory system: Clear to auscultation. Respiratory effort normal. Cardiovascular system: S1 & S2 heard, RRR.  Gastrointestinal system: Abdomen is soft Central nervous system: Somnolent, but  arousable Extremities: No edema Skin: No significant lesions noted    Data Reviewed: I have personally reviewed following labs and imaging studies  CBC: Recent Labs  Lab 03/25/24 1539 03/26/24 0455  WBC 8.4 10.1  NEUTROABS 6.6  --   HGB 13.7 13.1  HCT 41.0 40.2  MCV 89.7 88.5  PLT 353 293   Basic Metabolic  Panel: Recent Labs  Lab 03/25/24 1539 03/26/24 0455  NA 137 137  K 3.5 3.8  CL 100 100  CO2 28 27  GLUCOSE 96 96  BUN 12 10  CREATININE 0.63 0.66  CALCIUM 8.8* 8.7*   GFR: Estimated Creatinine Clearance: 77.2 mL/min (by C-G formula based on SCr of 0.66 mg/dL). Liver Function Tests: Recent Labs  Lab 03/25/24 1539  AST 35  ALT 16  ALKPHOS 94  BILITOT 0.9  PROT 6.0*  ALBUMIN 3.1*   No results for input(s): LIPASE, AMYLASE in the last 168 hours. No results for input(s): AMMONIA in the last 168 hours. Coagulation Profile: No results for input(s): INR, PROTIME in the last 168 hours. Cardiac Enzymes: Recent Labs  Lab 03/25/24 1539  CKTOTAL 1,556*   BNP (last 3 results) No results for input(s): PROBNP in the last 8760 hours. HbA1C: No results for input(s): HGBA1C in the last 72 hours. CBG: Recent Labs  Lab 03/25/24 1521  GLUCAP 84   Lipid Profile: No results for input(s): CHOL, HDL, LDLCALC, TRIG, CHOLHDL, LDLDIRECT in the last 72 hours. Thyroid  Function Tests: No results for input(s): TSH, T4TOTAL, FREET4, T3FREE, THYROIDAB in the last 72 hours. Anemia Panel: No results for input(s): VITAMINB12, FOLATE, FERRITIN, TIBC, IRON, RETICCTPCT in the last 72 hours. Sepsis Labs: No results for input(s): PROCALCITON, LATICACIDVEN in the last 168 hours.  Recent Results (from the past 240 hours)  CSF culture w Gram Stain     Status: None (Preliminary result)   Collection Time: 03/25/24 11:35 PM   Specimen: CSF; Cerebrospinal Fluid  Result Value Ref Range Status   Specimen Description   Final    CSF Performed at [facility], [address], [place], [place] [address]    Special Requests   Final    NONE Performed at [facility], [address], [place], [place] [address]    Gram Stain   Final    CYTOSPIN SMEAR WBC PRESENT, PREDOMINANTLY MONONUCLEAR NO ORGANISMS SEEN    Culture PENDING  Incomplete   Report Status  PENDING  Incomplete         Radiology Studies: MR Brain W and Wo Contrast Result Date: 03/25/2024 CLINICAL DATA:  Cerebral edema on CT, recent fall. EXAM: MRI HEAD WITHOUT AND WITH CONTRAST TECHNIQUE: Multiplanar, multiecho pulse sequences of the brain and surrounding structures were obtained without and with intravenous contrast. CONTRAST:  7mL GADAVIST GADOBUTROL 1 MMOL/ML IV SOLN COMPARISON:  Earlier same day head CT. FINDINGS: Brain: 1.1 x 1.0 x 1.3 cm nodular enhancement in the left basal ganglia involving the caudate nucleus with surrounding edema throughout the basal ganglia extending into the left temporal white matter. There is an additional focus of nodular enhancement in the left external capsule measures 1.2 x 1.0 x 1.5 cm. There is additional gyral enhancement within the anterior and medial left temporal lobe, irregular and gyral enhancement in the inferolateral left frontal lobe, and gyral enhancement along the inferior aspect of the left frontal lobe. There is edema throughout the left temporal lobe with associated mass effect on the left temporal horn. Additional edema noted in the ventral  aspect of both thalami. No significant midline shift. Enhancement also noted extending into the left hypothalamus extending into the tuber cinereum with small focus of enhancement extending into the right hypothalamus observed on coronal series 15 image 14. No acute infarct. No susceptibility weighted images due to patient discomfort. T2/FLAIR hyperintensity in the periventricular and subcortical white matter likely reflecting chronic microvascular ischemic changes. Posterior fossa is unremarkable. The basilar cisterns are patent. No extra-axial fluid collections. Ventricles: Prominence of the lateral ventricles suggestive of underlying parenchymal volume loss. Partial effacement of the left temporal horn. Vascular: Skull base flow voids are visualized. Skull and upper cervical spine: No focal abnormality.  Sinuses/Orbits: Orbits are symmetric. Mucosal thickening in the left ethmoid sinus. Other: Small left mastoid effusion. IMPRESSION: Nodular enhancement in the left basal ganglia and left external capsule as well as areas of gyral enhancement in the anterior left temporal lobe and inferior left frontal lobe. Additional enhancement extending into the left hypothalamus involving the tuber cinereum with small focus of enhancement extending into the right hypothalamus. 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 Additional considerations include metastatic disease, limbic/autoimmune encephalitis, herpes encephalitis among other etiologies. Recommend neurosurgical consultation and correlation with lumbar puncture. Edema throughout the left temporal lobe extending into the external capsule and basal ganglia with associated mass effect and partial effacement of the left temporal horn. No midline shift. No evidence of infarct. Mild chronic microvascular ischemic changes and parenchymal volume loss. Electronically Signed   By: Denny Flack M.D.   On: 03/25/2024 18:56   MR Cervical Spine Wo Contrast Result Date: 03/25/2024 CLINICAL DATA:  Cervical spine fracture EXAM: MRI CERVICAL SPINE WITHOUT CONTRAST TECHNIQUE: Multiplanar, multisequence MR imaging of the cervical spine was performed. No intravenous contrast was administered. COMPARISON:  03/25/2024 CT scan FINDINGS: Alignment: No vertebral subluxation is observed. Vertebrae: Acute fracture of the left posterior margin of the C5 spinous process with associated marrow edema, the fractures shown on image 17 series 12. Acute fracture of the spinous process of C6 centered about 1 cm from the posterior tip as shown on 22 series 12, with associated local marrow edema. Edema tracks along the adjacent interspinous ligaments especially at C5-6. Edema also extends along the interspinous ligament at C6-7. Facet edema on the left at C3-4 is likely degenerative on image 15 series  11. Severe degenerative findings are present at this level. Fused left facet joint at C4-5. Mild degenerative endosteal edema along the right facet joint at C3-4. Type 1 degenerative endplate findings at C6-7. Disc desiccation throughout the cervical spine with loss of disc height most notable at C5-6 and C6-7. Cord: No significant abnormal spinal cord signal is observed. Posterior Fossa, vertebral arteries, paraspinal tissues: As noted above, there is edema along the interspinous ligaments at C5-6 and C6-7 potentially reflecting ligamentous injury. There is mild but abnormal edema tracking along the anterior longitudinal ligament extending from about C3 through C6. Disc levels: C2-3: No impingement.  Right uncinate and facet spurring noted. C3-4: Moderate to prominent bilateral foraminal stenosis due to disc bulge, uncinate spurring, and facet arthropathy. C4-5: No impingement. Fused and somewhat degenerated left facet joint. C5-6: Mild to moderate left foraminal stenosis due to uncinate spurring and mild facet arthropathy. C6-7: Moderate bilateral foraminal stenosis and mild central narrowing of the thecal sac due to disc bulge, uncinate spurring, and facet arthropathy. C7-T1: Unremarkable IMPRESSION: 1. Acute fractures of the spinous processes of C5 and C6, with associated marrow edema. 2. Edema tracks along the interspinous ligaments at C5-6  and C6-7, potentially reflecting ligamentous injury. 3. Mild but abnormal edema tracking along the anterior longitudinal ligament extending from about C3 through C6, potentially reflecting ligamentous injury. 4. Cervical spondylosis and degenerative disc disease, causing moderate to prominent impingement at C3-4; moderate impingement at C6-7; and mild to moderate impingement at C5-6. Electronically Signed   By: Freida Jes M.D.   On: 03/25/2024 18:41   CT HEAD WO CONTRAST Result Date: 03/25/2024 CLINICAL DATA:  Head trauma, multiple falls, weakness while walking up  stairs. EXAM: CT HEAD WITHOUT CONTRAST CT CERVICAL SPINE WITHOUT CONTRAST TECHNIQUE: Multidetector CT imaging of the head and cervical spine was performed following the standard protocol without intravenous contrast. Multiplanar CT image reconstructions of the cervical spine were also generated. RADIATION DOSE REDUCTION: This exam was performed according to the departmental dose-optimization program which includes automated exposure control, adjustment of the mA and/or kV according to patient size and/or use of iterative reconstruction technique. COMPARISON:  MRI head 09/23/2021. FINDINGS: CT HEAD FINDINGS Brain: No acute intracranial hemorrhage. Hypoattenuation in the left temporal lobe without evidence of volume loss concerning for vasogenic edema. No significant midline shift. Mild parenchymal volume loss. The basilar cisterns are patent. Ventricles: There is mild mass effect on the left temporal horn. No hydrocephalus. Vascular: No hyperdense vessel or unexpected calcification. Skull: No acute or aggressive finding. Orbits: Orbits are symmetric. Sinuses: Mucosal thickening in the ethmoid sinuses. Other: Trace fluid in the left mastoid tip. CT CERVICAL SPINE FINDINGS Alignment: Straightening of the normal cervical lordosis. No significant listhesis. No facet subluxation or dislocation. Skull base and vertebrae: Mildly displaced and angulated fracture of C6 spinous process. There is an additional fracture of the C4 spinous process with well corticated margins. No compression fracture. No suspicious osseous lesion. Soft tissues and spinal canal: No prevertebral fluid or swelling. No visible canal hematoma. Disc levels: Moderate disc space narrowing at C5-6 and C6-7. Disc osteophyte complexes at multiple levels. No high-grade osseous spinal canal stenosis. Facet arthrosis at multiple levels foraminal narrowing is most pronounced at C3-4 and on the right at C6-7. Upper chest: Negative. Other: None. IMPRESSION:  Vasogenic edema in the left temporal lobe with mild mass effect on the left temporal horn. Recommend MRI head with without contrast for further evaluation. No midline shift. Displaced and slightly angulated fracture of the C6 spinous process and additional more chronic appearing fracture of the C4 spinous process. Recommend correlation with tenderness at these levels and consider MRI cervical spine for further evaluation. Chronic and degenerative changes as above. Electronically Signed   By: Denny Flack M.D.   On: 03/25/2024 16:21   CT CERVICAL SPINE WO CONTRAST Result Date: 03/25/2024 CLINICAL DATA:  Head trauma, multiple falls, weakness while walking up stairs. EXAM: CT HEAD WITHOUT CONTRAST CT CERVICAL SPINE WITHOUT CONTRAST TECHNIQUE: Multidetector CT imaging of the head and cervical spine was performed following the standard protocol without intravenous contrast. Multiplanar CT image reconstructions of the cervical spine were also generated. RADIATION DOSE REDUCTION: This exam was performed according to the departmental dose-optimization program which includes automated exposure control, adjustment of the mA and/or kV according to patient size and/or use of iterative reconstruction technique. COMPARISON:  MRI head 09/23/2021. FINDINGS: CT HEAD FINDINGS Brain: No acute intracranial hemorrhage. Hypoattenuation in the left temporal lobe without evidence of volume loss concerning for vasogenic edema. No significant midline shift. Mild parenchymal volume loss. The basilar cisterns are patent. Ventricles: There is mild mass effect on the left temporal horn. No hydrocephalus. Vascular: No  hyperdense vessel or unexpected calcification. Skull: No acute or aggressive finding. Orbits: Orbits are symmetric. Sinuses: Mucosal thickening in the ethmoid sinuses. Other: Trace fluid in the left mastoid tip. CT CERVICAL SPINE FINDINGS Alignment: Straightening of the normal cervical lordosis. No significant listhesis. No  facet subluxation or dislocation. Skull base and vertebrae: Mildly displaced and angulated fracture of C6 spinous process. There is an additional fracture of the C4 spinous process with well corticated margins. No compression fracture. No suspicious osseous lesion. Soft tissues and spinal canal: No prevertebral fluid or swelling. No visible canal hematoma. Disc levels: Moderate disc space narrowing at C5-6 and C6-7. Disc osteophyte complexes at multiple levels. No high-grade osseous spinal canal stenosis. Facet arthrosis at multiple levels foraminal narrowing is most pronounced at C3-4 and on the right at C6-7. Upper chest: Negative. Other: None. IMPRESSION: Vasogenic edema in the left temporal lobe with mild mass effect on the left temporal horn. Recommend MRI head with without contrast for further evaluation. No midline shift. Displaced and slightly angulated fracture of the C6 spinous process and additional more chronic appearing fracture of the C4 spinous process. Recommend correlation with tenderness at these levels and consider MRI cervical spine for further evaluation. Chronic and degenerative changes as above. Electronically Signed   By: Denny Flack M.D.   On: 03/25/2024 16:21   DG Chest Port 1 View Result Date: 03/25/2024 CLINICAL DATA:  weak EXAM: PORTABLE CHEST - 1 VIEW COMPARISON:  Mar 15, 2021 FINDINGS: No focal airspace consolidation or pleural effusion. No cardiomegaly. Tortuous aorta with aortic atherosclerosis. Multilevel thoracic osteophytosis. Apparent lucency overlying the lateral left seventh rib. IMPRESSION: 1. Apparent lucency overlying the lateral left seventh rib, likely artifactual from overlapping ribs and material external to the patient. No displaced rib fractures or visualized pneumothoraces. 2. No pneumonia or pulmonary edema. Electronically Signed   By: Rance Burrows M.D.   On: 03/25/2024 16:03   DG Pelvis Portable Result Date: 03/25/2024 CLINICAL DATA:  Fall.  Weakness.  EXAM: PORTABLE PELVIS 1-2 VIEWS COMPARISON:  None CT pelvis 03/07/2006 FINDINGS: Mild bilateral sacroiliac subchondral sclerosis. Moderate bilateral superior femoroacetabular joint space narrowing. Small chronic ossicles at the superolateral aspect of each femoroacetabular joint. The pubic symphysis joint space is maintained. No acute fracture is seen. No dislocation. IMPRESSION: 1. Moderate bilateral femoroacetabular osteoarthritis. 2. No acute fracture is seen. Electronically Signed   By: Bertina Broccoli M.D.   On: 03/25/2024 16:03        Scheduled Meds:  diltiazem  180 mg Oral Daily   donepezil  10 mg Oral Daily   escitalopram  5 mg Oral Daily   metoprolol succinate  50 mg Oral Daily   mirtazapine  15 mg Oral QHS   potassium chloride  40 mEq Oral Once   Continuous Infusions:  lactated ringers        LOS: 1 day    Time spent: 55 minutes    Silena Wyss Loran Rock, DO [facility]  If 7PM-7AM, please contact night-coverage [email or web address] 03/26/2024, 8:39 AM

## 2024-03-26 NOTE — Progress Notes (Signed)
 Neurosurgery  78 yo M with PD, dementia had recent fall, suffered cervical spinous process fractures.  There appears to be mild strain injury of ALL and PLL, so I recommend a rigid cervical collar for 6 weeks for this.  He was found to have an unusual lesion of the left anterior temporal lobe, basal ganglia, bilateral hypothalamus.  In the temporal lobe there is gyriform enhancement pattern suggestive of leptomeningeal involvement.  Possibilities include viral or autoimmune encephalitis, vasculitis, neoplasm, post-ischemic phenomenon, etc.  Recommend workup by neurology as well as metastatic workup.  This lesion is certainly not amenable to surgical resection.

## 2024-03-26 NOTE — ED Notes (Signed)
 Brief changed ?

## 2024-03-26 NOTE — ED Notes (Signed)
C collar still in place.

## 2024-03-26 NOTE — ED Notes (Signed)
 If pts spouse, Nevyn Bossman,  is not at bedside when pt is transported, she would like to be called at [phone] in regards to room number at [facility]

## 2024-03-26 NOTE — Progress Notes (Signed)
 Patient arrived from [facility] ED to 3W10 via [facility] staff. Patient has on cervical collar, mildly uncooperative and restless. Cardiac monitoring applied, vitals obtained. Wife at bedside.

## 2024-03-27 ENCOUNTER — Inpatient Hospital Stay (HOSPITAL_COMMUNITY)

## 2024-03-27 DIAGNOSIS — D496 Neoplasm of unspecified behavior of brain: Secondary | ICD-10-CM | POA: Diagnosis not present

## 2024-03-27 DIAGNOSIS — R569 Unspecified convulsions: Secondary | ICD-10-CM

## 2024-03-27 DIAGNOSIS — G939 Disorder of brain, unspecified: Secondary | ICD-10-CM | POA: Diagnosis not present

## 2024-03-27 DIAGNOSIS — G20A1 Parkinson's disease without dyskinesia, without mention of fluctuations: Secondary | ICD-10-CM | POA: Diagnosis not present

## 2024-03-27 DIAGNOSIS — R296 Repeated falls: Secondary | ICD-10-CM | POA: Diagnosis not present

## 2024-03-27 LAB — COMPREHENSIVE METABOLIC PANEL WITH GFR
ALT: 16 U/L (ref 0–44)
AST: 30 U/L (ref 15–41)
Albumin: 2.8 g/dL — ABNORMAL LOW (ref 3.5–5.0)
Alkaline Phosphatase: 93 U/L (ref 38–126)
Anion gap: 8 (ref 5–15)
BUN: 9 mg/dL (ref 8–23)
CO2: 29 mmol/L (ref 22–32)
Calcium: 8.8 mg/dL — ABNORMAL LOW (ref 8.9–10.3)
Chloride: 98 mmol/L (ref 98–111)
Creatinine, Ser: 0.74 mg/dL (ref 0.61–1.24)
GFR, Estimated: >60 mL/min (ref >60)
Glucose, Bld: 96 mg/dL (ref 70–99)
Potassium: 3.7 mmol/L (ref 3.5–5.1)
Sodium: 135 mmol/L (ref 135–145)
Total Bilirubin: 1 mg/dL (ref 0.0–1.2)
Total Protein: 5.9 g/dL — ABNORMAL LOW (ref 6.5–8.1)

## 2024-03-27 LAB — MAGNESIUM: Magnesium: 2 mg/dL (ref 1.7–2.4)

## 2024-03-27 LAB — CBC WITH DIFFERENTIAL/PLATELET
Abs Immature Granulocytes: 0.03 10*3/uL (ref 0.00–0.07)
Basophils Absolute: 0 10*3/uL (ref 0.0–0.1)
Basophils Relative: 0 %
Eosinophils Absolute: 0.1 10*3/uL (ref 0.0–0.5)
Eosinophils Relative: 1 %
HCT: 41.5 % (ref 39.0–52.0)
Hemoglobin: 14 g/dL (ref 13.0–17.0)
Immature Granulocytes: 0 %
Lymphocytes Relative: 11 %
Lymphs Abs: 1 10*3/uL (ref 0.7–4.0)
MCH: 29.6 pg (ref 26.0–34.0)
MCHC: 33.7 g/dL (ref 30.0–36.0)
MCV: 87.7 fL (ref 80.0–100.0)
Monocytes Absolute: 0.7 10*3/uL (ref 0.1–1.0)
Monocytes Relative: 7 %
Neutro Abs: 7.8 10*3/uL — ABNORMAL HIGH (ref 1.7–7.7)
Neutrophils Relative %: 81 %
Platelets: 336 10*3/uL (ref 150–400)
RBC: 4.73 MIL/uL (ref 4.22–5.81)
RDW: 11.7 % (ref 11.5–15.5)
WBC: 9.7 10*3/uL (ref 4.0–10.5)
nRBC: 0 % (ref 0.0–0.2)

## 2024-03-27 LAB — GLUCOSE, CAPILLARY
Glucose-Capillary: 109 mg/dL — ABNORMAL HIGH (ref 70–99)
Glucose-Capillary: 124 mg/dL — ABNORMAL HIGH (ref 70–99)
Glucose-Capillary: 70 mg/dL (ref 70–99)
Glucose-Capillary: 72 mg/dL (ref 70–99)
Glucose-Capillary: 96 mg/dL (ref 70–99)

## 2024-03-27 MED ORDER — LACTATED RINGERS IV SOLN: INTRAVENOUS | Status: DC

## 2024-03-27 MED ORDER — SODIUM CHLORIDE 0.9 % IV SOLN
3.0000 g | Freq: Four times a day (QID) | INTRAVENOUS | Status: DC
  Administered 2024-03-27 – 2024-04-03 (×27): 3 g via INTRAVENOUS
  Filled 2024-03-27 (×27): qty 8

## 2024-03-27 MED ORDER — DEXTROSE 5 % IV SOLN
10.0000 mg/kg | Freq: Three times a day (TID) | INTRAVENOUS | Status: DC
  Administered 2024-03-27 – 2024-03-28 (×3): 685 mg via INTRAVENOUS
  Filled 2024-03-27 (×5): qty 13.7

## 2024-03-27 NOTE — Consult Note (Addendum)
 NEUROLOGY CONSULT NOTE   Date of service: March 27, 2024 Patient Name: Steven Gibbs MRN:  [id number] DOB:  [birth year] Chief Complaint: Multiple falls Requesting Provider: Danette Duos, MD  History of Present Illness  [name] is a 78 y.o. male with hx of Parkinson's disease with dementia, multiple falls due to unsteady gait, HTN, hypercholesterolemia and HTN who presented on 6/10 after having multiple falls at home, culminating in an episode of severe weakness with inability to get up in the evening on 6/9. He had walked up stairs and became weak and then sat down. Wife could not get him back up, so she let him sleep on the floor. When help came the next morning to assist in getting him up, he screamed in pain, so they called 911. In the ED, CT head and neck was ordered, revealing vasogenic edema in the left frontotemporal area, as well as C5 and C6 spinous process and interspinous/prevertebral edema.   MRI was then obtained, revealing a large infiltrative lesion involving the left temporal and frontal lobes, with associated enhancement and mass effect. Neurology was consulted to further evaluate.     ROS  Unable to ascertain due to AMS.   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) 03/25/2024   Difficulty walking 09/05/2012   IMO SNOMED Dx Update Oct 2024     Essential hypertension 03/25/2024   Fracture of cervical spinous process, initial encounter (HCC) 03/25/2024   Fracture of right ankle, lateral malleolus 09/05/2012   High cholesterol    Hypertension    Memory loss    Multiple falls 03/25/2024   Screening for malignant neoplasm of colon 03/25/2024    Past Surgical History:  Procedure Laterality Date   BACK SURGERY     ORIF ANKLE FRACTURE  07/11/2012   Procedure: OPEN REDUCTION INTERNAL FIXATION (ORIF) ANKLE FRACTURE;  Surgeon: Amada Backer, MD;  Location: [facility];   Service: Orthopedics;  Laterality: Right;  Lateral malleous /  syndesmosis disruption     Family History: Family History  Problem Relation Age of Onset   Other Mother        complications from hip replacment surgery - blood clot   Heart failure Father     Social History  reports that he quit smoking about 41 years ago. His smoking use included cigarettes. He does not have any smokeless tobacco history on file. He reports current alcohol use. He reports that he does not use drugs.  No Known Allergies  Medications   Current Facility-Administered Medications:    acetaminophen (TYLENOL) tablet 650 mg, 650 mg, Oral, Q6H PRN **OR** acetaminophen (TYLENOL) suppository 650 mg, 650 mg, Rectal, Q6H PRN, Emokpae, Ejiroghene E, MD   diltiazem (CARDIZEM CD) 24 hr capsule 180 mg, 180 mg, Oral, Daily, Emokpae, Ejiroghene E, MD, 180 mg at 03/26/24 1052   donepezil (ARICEPT) tablet 10 mg, 10 mg, Oral, Daily, Emokpae, Ejiroghene E, MD, 10 mg at 03/26/24 1051   escitalopram (LEXAPRO) tablet 5 mg, 5 mg, Oral, Daily, Emokpae, Ejiroghene E, MD, 5 mg at 03/26/24 1051   metoprolol succinate (TOPROL-XL) 24 hr tablet 50 mg, 50 mg, Oral, Daily, Emokpae, Ejiroghene E, MD, 50 mg at 03/26/24 1050   mirtazapine (REMERON) tablet 15 mg, 15 mg, Oral, QHS, Emokpae, Ejiroghene E, MD, 15 mg at 03/26/24 2148   ondansetron  (ZOFRAN ) tablet 4 mg, 4 mg, Oral, Q6H PRN **OR** ondansetron  (ZOFRAN )  injection 4 mg, 4 mg, Intravenous, Q6H PRN, Emokpae, Ejiroghene E, MD   polyethylene glycol (MIRALAX / GLYCOLAX) packet 17 g, 17 g, Oral, Daily PRN, Emokpae, Ejiroghene E, MD   potassium chloride (KLOR-CON) packet 40 mEq, 40 mEq, Oral, Once, Emokpae, Ejiroghene E, MD  Vitals   Vitals:   03/26/24 2012 03/27/24 0007 03/27/24 0346 03/27/24 0802  BP: 136/77 (!) 145/73 127/68 (P) 129/80  Pulse: 61 60 60 (!) (P) 53  Resp: 20 20 20    Temp: 98 F (36.7 C) 97.9 F (36.6 C) 98.9 F (37.2 C) (P) 98.8 F (37.1 C)  TempSrc: Axillary Oral  Axillary (P) Oral  SpO2: 93% 98% 95% (P) 96%  Weight:      Height:        Body mass index is 23.13 kg/m.   Physical Exam   Physical Exam  HEENT:  Normocephalic. C-collar in place.  Lungs: Respirations unlabored Extremities: Warm and well perfused.   Neurological Examination Mental Status: Lethargic, with eyes closed for most of the exam. Unable to answer any orientation questions. Speech is hypophonic and he tends to mumble with mild dysarthria, but is intelligible at times. Essentially all speech output has low information content and he is unable to meaningfully contribute to the history or give a description of his recent symptoms. Does not answer any orientation questions. Can follow some simple commands. Poor attention. Not able to participate in testing of naming.   Cranial Nerves: II: Due to poor attention and lethargy, he is unable to participate in formal visual fields testing. Does not blink to threat consistently.    III,IV, VI: Can track to left and right. Gaze is conjugate. No nystagmus.  V: Reacts to eyelid stimulation bilaterally VII: Smile symmetric VIII: Hearing intact to some questions IX,X: Hypophonic speech XI: Symmetric XII: Midline tongue extension Motor: Right : Upper extremity   4+/5   Left:     Upper extremity   4+/5  Lower extremity   4+/5    Lower extremity   4+/5 BUE low amplitude tremor is noted intermittently Sensory: Light touch intact throughout, bilaterally Deep Tendon Reflexes: 2+ and symmetric throughout Cerebellar: No ataxia with FNF bilaterally Gait: Deferred   Labs/Imaging/Neurodiagnostic studies   CBC:  Recent Labs  Lab 2024/04/24 1539 03/26/24 0455 03/27/24 0553  WBC 8.4 10.1 9.7  NEUTROABS 6.6  --  7.8*  HGB 13.7 13.1 14.0  HCT 41.0 40.2 41.5  MCV 89.7 88.5 87.7  PLT 353 293 336   Basic Metabolic Panel:  Lab Results  Component Value Date   NA 135 03/27/2024   K 3.7 03/27/2024   CO2 29 03/27/2024   GLUCOSE 96 03/27/2024    BUN 9 03/27/2024   CREATININE 0.74 03/27/2024   CALCIUM 8.8 (L) 03/27/2024   GFRNONAA >60 03/27/2024   MRI cervical spine: 1. Acute fractures of the spinous processes of C5 and C6, with associated marrow edema. 2. Edema tracks along the interspinous ligaments at C5-6 and C6-7, potentially reflecting ligamentous injury. 3. Mild but abnormal edema tracking along the anterior longitudinal ligament extending from about C3 through C6, potentially reflecting ligamentous injury. 4. Cervical spondylosis and degenerative disc disease, causing moderate to prominent impingement at C3-4; moderate impingement at C6-7; and mild to moderate impingement at C5-6.  MRI brain w/wo contrast:  - Nodular enhancement in the left basal ganglia and left external capsule as well as areas of gyral enhancement in the anterior left temporal lobe and inferior left frontal lobe. Additional enhancement  extending into the left hypothalamus involving the tuber cinereum with small focus of enhancement extending into the right hypothalamus. 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 Additional considerations include metastatic disease, limbic/autoimmune encephalitis, herpes encephalitis among other etiologies. Recommend neurosurgical consultation and correlation with lumbar puncture. - Edema throughout the left temporal lobe extending into the external capsule and basal ganglia with associated mass effect and partial effacement of the left temporal horn. No midline shift. - No evidence of infarct. - Mild chronic microvascular ischemic changes and parenchymal volume loss.  ASSESSMENT  Elston K Lansberry is a 78 y.o. male presenting with multiple falls. MRI with and without contrast reveals an invasive-appearing, partially-enhancing lesion involving the left anterior temporal lobe, basal ganglia and bilateral hypothalamus. In the temporal lobe there is gyriform enhancement pattern suggestive of leptomeningeal  involvement; nodular enhancement is also present within the brain parenchyma. 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 The DDx also includes viral or autoimmune encephalitis, vasculitis, metastatic disease, post-ischemic phenomenon, neurosarcoidosis, CNS lymphoma and PML.  - Exam reveals tremor and cognitive deficits.  - MRI brain findings are as noted above.   - Labs: - CBC is normal without leukocytosis - BMP is unremarkable from a neurological standpoint.  - CSF: Clear, colorless, 210 RBC tube 1 >> 16 RBC tube 4, 1 WBC per microliter (normal), total protein elevated at 76. Meningitis/encephalitis PCR panel is pan-negative.  - CSF culture with no growth at 1 day.  - Impression: Left temporal lobe partially enhancing lesion with extension into the medial right frontal lobe and bilateral hypothalami. 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 The DDx also includes viral or autoimmune encephalitis, vasculitis, metastatic disease, post-ischemic phenomenon, neurosarcoidosis, CNS lymphoma and PML. No neck stiffness, fever or white count to suggest an infectious etiology.   RECOMMENDATIONS  - May need a repeat LP for cytology and flow cytometry, but sensitivity of these tests can be low and cannot rule out an infiltrative process if the LP is negative for malignant cells.   - CT of C/A/P with contrast to assess for possible primary tumor. If negative and if LP is unrevealing, may need to discuss risks/benefits of a possible biopsy with Neurosurgery   Addendum: CT of chest/abdomen/pelvis with contrast: 1. Trace bilateral pleural effusions. Posterior lower lobe atelectasis or pneumonia. Bronchial wall thickening and occlusive debris throughout the left lower lobe bronchi favors aspiration. 2. Mildly displaced fractures of the right eighth through twelfth ribs. No evidence of underlying pathologic lesion. 3. Enlarged prostate indenting the floor of the bladder.  Mild bladder wall thickening may be due to chronic outlet obstruction. 4. Aortic Atherosclerosis (ICD10-I70.0).  ______________________________________________________________________   Hope Ly, Shields Pautz, MD [facility]

## 2024-03-27 NOTE — Consult Note (Signed)
 HPI:    Pt presented to [facility] ED after reportedly being found down by his wife. PMH notable for Parkinson's. Workup revealing vasogenic edema and lesion to L frontotemporal area. Also w/ C5, C6 spinous process and interspinous/prevertebral edema. Transferred to [facility] for further mgmt.     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) 03/25/2024   Difficulty walking 09/05/2012   IMO SNOMED Dx Update Oct 2024     Essential hypertension 03/25/2024   Fracture of cervical spinous process, initial encounter (HCC) 03/25/2024   Fracture of right ankle, lateral malleolus 09/05/2012   High cholesterol    Hypertension    Memory loss    Multiple falls 03/25/2024   Screening for malignant neoplasm of colon 03/25/2024    Past Surgical History:  Procedure Laterality Date   BACK SURGERY     ORIF ANKLE FRACTURE  07/11/2012   Procedure: OPEN REDUCTION INTERNAL FIXATION (ORIF) ANKLE FRACTURE;  Surgeon: Amada Backer, MD;  Location: [facility];  Service: Orthopedics;  Laterality: Right;  Lateral malleous /  syndesmosis disruption     Medications Prior to Admission  Medication Sig Dispense Refill Last Dose/Taking   atorvastatin (LIPITOR) 10 MG tablet Take 10 mg by mouth daily.   03/24/2024 Morning   Cyanocobalamin  (B-12 PO) Take 1,000 mcg by mouth daily.   03/24/2024 Morning   diltiazem (DILACOR XR) 180 MG 24 hr capsule  Take 180 mg by mouth daily.   03/24/2024 Morning   escitalopram (LEXAPRO) 10 MG tablet Take 5 mg by mouth daily.   03/24/2024 Morning   metoprolol succinate (TOPROL-XL) 50 MG 24 hr tablet Take 50 mg by mouth daily. Take with or immediately following a meal.   03/24/2024 Morning   mirtazapine (REMERON) 15 MG tablet Take 15 mg by mouth at bedtime.   03/24/2024 Bedtime   thiamine (VITAMIN B-1) 100 MG tablet Take 100 mg by mouth daily.   03/24/2024 Morning   donepezil (ARICEPT) 10 MG tablet Take 10 mg by mouth daily.   Unknown   No Known Allergies  Social History   Tobacco Use   Smoking status: Former    Current packs/day: 0.00    Types: Cigarettes    Quit date: 07/08/1982    Years since quitting: 41.7   Smokeless tobacco: Not on file  Substance Use Topics   Alcohol use: Yes    Comment: social    Family History  Problem Relation Age of Onset   Other Mother        complications from hip replacment surgery - blood clot   Heart failure Father      Objective:   Patient Vitals for the past 8 hrs:  BP Temp Temp src Pulse Resp SpO2  03/27/24 0802 (P) 129/80 (P) 98.8 F (37.1 C) (P) Oral (!) (P) 53 -- (P) 96 %  03/27/24 0346 127/68 98.9 F (37.2 C) Axillary 60 20 95 %  I/O last 3 completed shifts: In: 2240 [P.O.:240; IV Piggyback:2000] Out: 850 [Urine:850] No intake/output data recorded.  Somnolent, incomprehensible speech.  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 In the temporal lobe there is gyriform enhancement pattern suggestive of leptomeningeal involvement. Possibilities include viral or autoimmune encephalitis, vasculitis, neoplasm, post-ischemic phenomenon, etc.    Plan:   Recommend workup by neurology as well as metastatic workup. This lesion is certainly not amenable to surgical resection.

## 2024-03-27 NOTE — Progress Notes (Signed)
 PHARMACY ANTIBIOTIC CONSULT NOTE   Steven Gibbs a 78 y.o. male with vasoenic edema and lesion to L frontotemporal area. Also w/ C5, C6 spinous process and interspinous/prevertebral edema.  Pharmacy has been consulted for acyclovir dosing.  EEG with moderate diffuse encephalopathy, no sz noted. 6/10 Meningitis/encephalitis panel negative. WBC,CSF <10. Neurology and NSGY consulted.   Scr 0.74 (03/27/2024), WBC 9.7 (03/27/2024), afebrile   Estimated Creatinine Clearance: 77.2 mL/min (by C-G formula based on SCr of 0.74 mg/dL).  Plan: START acyclovir 695 mg IV Q8H  Maintenance IVF while on acyclovir- LR @100  mL/h  Monitor renal function, clinical status, C/S, de-escalation   Allergies:  No Known Allergies  Filed Weights   03/25/24 1514  Weight: 69 kg (152 lb 1.9 oz)       Latest Ref Rng & Units 03/27/2024    5:53 AM 03/26/2024    4:55 AM 03/25/2024    3:39 PM  CBC  WBC 4.0 - 10.5 K/uL 9.7  10.1  8.4   Hemoglobin 13.0 - 17.0 g/dL 40.9  81.1  91.4   Hematocrit 39.0 - 52.0 % 41.5  40.2  41.0   Platelets 150 - 400 K/uL 336  293  353     Antibiotics Given (last 72 hours)     None       Thank you for allowing pharmacy to be a part of this patient's care.  Chrystie Crass, PharmD Clinical Pharmacist  03/27/2024 3:45 PM

## 2024-03-27 NOTE — Procedures (Signed)
 Patient Name: EMRE STOCK  MRN: [id number]  Epilepsy Attending: Arleene Lack  Referring Physician/Provider: Angelene Kelly, MD  Date: 03/27/2024 Duration: 29.41 mins  Patient history:  78 yo male with unusual lesion of the left anterior temporal lobe, basal ganglia, bilateral hypothalamus. EEG to evaluate for seizure  Level of alertness: Awake  AEDs during EEG study: None  Technical aspects: This EEG study was done with scalp electrodes positioned according to the 10-20 International system of electrode placement. Electrical activity was reviewed with band pass filter of 1-70Hz , sensitivity of 7 uV/mm, display speed of 57mm/sec with a 60Hz  notched filter applied as appropriate. EEG data were recorded continuously and digitally stored.  Video monitoring was available and reviewed as appropriate.  Description: EEG showed continuous generalized 3 to 6 Hz theta-delta slowing. Hyperventilation and photic stimulation were not performed.     ABNORMALITY - Continuous slow, generalized  IMPRESSION: This study is suggestive of moderate diffuse encephalopathy. No seizures or epileptiform discharges were seen throughout the recording.  Bridey Brookover O Aryannah Mohon

## 2024-03-27 NOTE — TOC CAGE-AID Note (Signed)
 Transition of Care [facility]) - CAGE-AID Screening   Patient Details  Name: Steven Gibbs MRN: [id number] Date of Birth: 26-Apr-1946  Transition of Care [facility]) CM/SW Contact:    [name] E [name], LCSW Phone Number: 03/27/2024, 11:51 AM   Clinical Narrative:    CAGE-AID Screening: Substance Abuse Screening unable to be completed due to: : Patient unable to participate

## 2024-03-27 NOTE — TOC Initial Note (Signed)
 Transition of Care [facility]) - Initial/Assessment Note    Patient Details  Name: Steven Gibbs MRN: [id number] Date of Birth: [birth year]  Transition of Care [facility]) CM/SW Contact:    Jonathan Neighbor, RN Phone Number: 03/27/2024, 11:46 AM  Clinical Narrative:                  Pt is from home with his spouse. Spouse is with him most of the time. If she leaves the home she has a caregiver that stays with the patient.  Spouse manages his medications and provides needed transportation. Pt had 2 falls at home and spent night on floor. Awaiting therapy evals.  TOC following.  Expected Discharge Plan:  (TBD) Barriers to Discharge: Continued Medical Work up   Patient Goals and CMS Choice            Expected Discharge Plan and Services       Living arrangements for the past 2 months: Single Family Home                                      Prior Living Arrangements/Services Living arrangements for the past 2 months: Single Family Home Lives with:: Spouse Patient language and need for interpreter reviewed:: Yes Do you feel safe going back to the place where you live?: Yes        Care giver support system in place?: Yes (comment) Current home services: DME (walker/ wheelchair x 2/ shower seat) Criminal Activity/Legal Involvement Pertinent to Current Situation/Hospitalization: No - Comment as needed  Activities of Daily Living      Permission Sought/Granted                  Emotional Assessment Appearance:: Appears stated age   Affect (typically observed): Quiet     Psych Involvement: No (comment)  Admission diagnosis:  Brain mass [G93.89] Brain lesion [G93.9] Fall, initial encounter [W19.XXXA] Traumatic rhabdomyolysis, initial encounter (HCC) [T79.6XXA] Closed nondisplaced fracture of fifth cervical vertebra, unspecified fracture morphology, initial encounter [facility]) [S12.401A] Patient Active Problem List   Diagnosis Date Noted   Brain lesion 03/25/2024    Multiple falls 03/25/2024   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) 03/25/2024   Fracture of cervical spinous process, initial encounter (HCC) 03/25/2024   Essential hypertension 03/25/2024   Mixed hyperlipidemia 03/25/2024   Screening for malignant neoplasm of colon 03/25/2024   Left ankle sprain 09/05/2012   Fracture of right ankle, lateral malleolus 09/05/2012   Pain in joint, ankle and foot 09/05/2012   Difficulty walking 09/05/2012   PCP:  Artemisa Bile, MD Pharmacy:   [facility] [address] [place] - [address] [place] [address] Phone: [phone] Fax: [phone]  [facility] - [place], [place] - [address] [place] [address] Phone: [phone] Fax: [phone]     Social Drivers of Health (SDOH) Social History: SDOH Screenings   Transportation Needs: Patient Unable To Answer (03/27/2024)  Utilities: Patient Unable To Answer (03/27/2024)  Tobacco Use: Medium Risk (03/25/2024)   SDOH Interventions:     Readmission Risk Interventions     No data to display

## 2024-03-27 NOTE — Plan of Care (Signed)
  Problem: Clinical Measurements: Goal: Ability to maintain clinical measurements within normal limits will improve Outcome: Progressing Goal: Will remain free from infection Outcome: Progressing Goal: Diagnostic test results will improve Outcome: Progressing Goal: Respiratory complications will improve Outcome: Progressing Goal: Cardiovascular complication will be avoided Outcome: Progressing   Problem: Coping: Goal: Level of anxiety will decrease Outcome: Progressing   Problem: Elimination: Goal: Will not experience complications related to bowel motility Outcome: Progressing Goal: Will not experience complications related to urinary retention Outcome: Progressing   Problem: Pain Managment: Goal: General experience of comfort will improve and/or be controlled Outcome: Progressing   Problem: Safety: Goal: Ability to remain free from injury will improve Outcome: Progressing   Problem: Skin Integrity: Goal: Risk for impaired skin integrity will decrease Outcome: Progressing   Problem: Education: Goal: Knowledge of General Education information will improve Description: Including pain rating scale, medication(s)/side effects and non-pharmacologic comfort measures Outcome: Not Progressing   Problem: Health Behavior/Discharge Planning: Goal: Ability to manage health-related needs will improve Outcome: Not Progressing   Problem: Activity: Goal: Risk for activity intolerance will decrease Outcome: Not Progressing   Problem: Nutrition: Goal: Adequate nutrition will be maintained Outcome: Not Progressing

## 2024-03-27 NOTE — Progress Notes (Signed)
 SLP Cancellation Note  Patient Details Name: ADOLPH CLUTTER MRN: [id number] DOB: [birth year]   Cancelled treatment:       Reason Eval/Treat Not Completed: Fatigue/lethargy limiting ability to participate;Patient at procedure or test/unavailable. Pt lethargic and did not open eyes to verbal/tactile stimulation. Did mumble something unintelligible in response. EEG tech at bedside setting up equipment. SLP will re-attempt clinical swallow assessment once pt is more alert.    Berlin Mokry J Sharetta Ricchio 03/27/2024, 9:26 AM

## 2024-03-27 NOTE — Progress Notes (Signed)
 PROGRESS NOTE    Steven Gibbs  ZOX:[id number] DOB: [birth year] DOA: 03/25/2024 PCP: Artemisa Bile, MD   Brief Narrative: 78 year old with past medical history significant for Parkinson dementia, hypertension present weakness and fall.  Patient fell on the floor able to get him on the slip on the floor.  Next day he was complaining of pain.  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  Lumbar puncture performed by EDP on 6/10 with no concerning findings.  Transferred to [facility] for further evaluation  At baseline patient is sometimes able to recognize his wife, sometimes will answer simple questions, over the past 6 weeks patient had decline and has had multiple falls.  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 He has  baseline dementia, Parkinson's disease.  - MRI brain: Nodular enhancement left basal ganglia, left external capsule, area of gyral enhancement anterior left temporal and inferior frontal lobe, left hypothalamus.  -Neurosurgery evaluated patient, recommend neurology evaluation as well as workup.  These lesions are not amenable to  surgical resection. - Underwent  LP: CSF: White blood cell 2: Lymphocyte 85% meningitis encephalitis panel negative, obtain 76. Lymphocyte flow cytology pending.  -Will need to repeat LP to send labs  fungal culture, VDRL (will discussed with neurology). Asked lab to sent IgG index from spinal fluid available. Unclear if cytology was sent.  -EEG: Continuous slow generalized.  -Neurology consulted. Started Acyclovir in meantime.  -CT Chest , abdomen-pelvis: trace bilateral pleural effusion, posterior lower lobe  atelectasis or PNA, Bronchia wall thickening.  Enlarged prostate indenting the floor of the bladder. Mild bladder wall thickening may be due to chronic outlet obstruction. Aortic Atherosclerosis   Aspiration PNA;  CT chest: Posterior lower lobe atelectasis or pneumonia. Bronchial wall thickening and occlusive debris throughout the left lower lobe bronchi favors aspiration. -Start Unasyn    Fracture of the cervical spinous process Neurosurgery recommend C collar.   Essential hypertension Continue Cardizem and metoprolol.   Dementia, Parkinson's disease Continue with Aricept, Lexapro, Remeron.   Mildly displaced fractures of the right eighth through twelfth ribs. No evidence of underlying pathologic lesion. -Incentive spirometry  Estimated body mass index is 23.13 kg/m as calculated from the following:   Height as of this encounter: 5' 8 (1.727 m).   Weight as of this encounter: 69 kg.   DVT prophylaxis: SCD Code Status: Full code Family Communication: care discussed with wife who was at bedside.  Disposition Plan:  Status is: Inpatient Remains inpatient appropriate because: management of brain lesion, encephalopathy     Consultants:  Neurology Neurosurgery   Procedures:  LP 6/10  Antimicrobials:    Subjective: He is sleepy, open eyes to voice, fall back to sleep.    Objective: Vitals:   03/26/24 1840 03/26/24 2012 03/27/24 0007 03/27/24 0346  BP: (!) 146/65 136/77 (!) 145/73 127/68  Pulse: 82 61 60 60  Resp: 19 20 20 20   Temp: 97.7 F (36.5 C) 98 F (36.7 C) 97.9 F (36.6 C) 98.9 F (37.2 C)  TempSrc: Oral Axillary Oral Axillary  SpO2: 91% 93% 98% 95%  Weight:      Height:        Intake/Output  Summary (Last 24 hours) at 03/27/2024 0751 Last data filed at 03/27/2024 6962 Gross per 24 hour  Intake 240 ml  Output 850 ml  Net -610 ml   Filed Weights   03/25/24 1514  Weight: 69 kg    Examination:  General exam: Appears calm and comfortable,  cervical collar Respiratory system: Clear to auscultation. Respiratory effort normal. Cardiovascular system: S1 & S2 heard, RRR. Gastrointestinal system: Abdomen is nondistended, soft and nontender. No organomegaly or masses felt. Normal bowel sounds heard. Central nervous system: Alert and oriented. No focal neurological deficits. Extremities: Symmetric 5 x 5 power. S  Data Reviewed: I have personally reviewed following labs and imaging studies  CBC: Recent Labs  Lab 03/25/24 1539 03/26/24 0455 03/27/24 0553  WBC 8.4 10.1 9.7  NEUTROABS 6.6  --  7.8*  HGB 13.7 13.1 14.0  HCT 41.0 40.2 41.5  MCV 89.7 88.5 87.7  PLT 353 293 336   Basic Metabolic Panel: Recent Labs  Lab 03/25/24 1539 03/26/24 0455 03/27/24 0553  NA 137 137 135  K 3.5 3.8 3.7  CL 100 100 98  CO2 28 27 29   GLUCOSE 96 96 96  BUN 12 10 9   CREATININE 0.63 0.66 0.74  CALCIUM 8.8* 8.7* 8.8*   GFR: Estimated Creatinine Clearance: 77.2 mL/min (by C-G formula based on SCr of 0.74 mg/dL). Liver Function Tests: Recent Labs  Lab 03/25/24 1539 03/27/24 0553  AST 35 30  ALT 16 16  ALKPHOS 94 93  BILITOT 0.9 1.0  PROT 6.0* 5.9*  ALBUMIN 3.1* 2.8*   No results for input(s): LIPASE, AMYLASE in the last 168 hours. No results for input(s): AMMONIA in the last 168 hours. Coagulation Profile: No results for input(s): INR, PROTIME in the last 168 hours. Cardiac Enzymes: Recent Labs  Lab 03/25/24 1539  CKTOTAL 1,556*   BNP (last 3 results) No results for input(s): PROBNP in the last 8760 hours. HbA1C: No results for input(s): HGBA1C in the last 72 hours. CBG: Recent Labs  Lab 03/25/24 1521 03/26/24 1845 03/26/24 2135 03/27/24 0010 03/27/24 0624  GLUCAP 84 81 70 72 70   Lipid Profile: No results for input(s): CHOL, HDL, LDLCALC, TRIG, CHOLHDL, LDLDIRECT in the last 72 hours. Thyroid  Function Tests: No results for input(s): TSH, T4TOTAL, FREET4, T3FREE, THYROIDAB  in the last 72 hours. Anemia Panel: No results for input(s): VITAMINB12, FOLATE, FERRITIN, TIBC, IRON, RETICCTPCT in the last 72 hours. Sepsis Labs: No results for input(s): PROCALCITON, LATICACIDVEN in the last 168 hours.  Recent Results (from the past 240 hours)  CSF culture w Gram Stain     Status: None (Preliminary result)   Collection Time: 03/25/24 11:35 PM   Specimen: CSF; Cerebrospinal Fluid  Result Value Ref Range Status   Specimen Description   Final    CSF Performed at [facility], [address], [place], [place] [address]    Special Requests   Final    NONE Performed at [facility], [address], [place], [place] [address]    Gram Stain   Final    CYTOSPIN SMEAR WBC PRESENT, PREDOMINANTLY MONONUCLEAR NO ORGANISMS SEEN    Culture PENDING  Incomplete   Report Status PENDING  Incomplete         Radiology Studies: CT CHEST ABDOMEN PELVIS W CONTRAST Result Date: 03/26/2024 CLINICAL DATA:  Staging metastasis for musculoskeletal neoplasm. EXAM: CT CHEST, ABDOMEN, AND PELVIS WITH CONTRAST TECHNIQUE: Multidetector CT imaging of the chest, abdomen and pelvis was performed following the standard  protocol during bolus administration of intravenous contrast. RADIATION DOSE REDUCTION: This exam was performed according to the departmental dose-optimization program which includes automated exposure control, adjustment of the mA and/or kV according to patient size and/or use of iterative reconstruction technique. CONTRAST:  60mL OMNIPAQUE IOHEXOL 350 MG/ML SOLN COMPARISON:  Pelvis and chest radiograph 03/25/2024 FINDINGS: CT CHEST FINDINGS Cardiovascular: Normal caliber thoracic aorta. Aortic and coronary artery atherosclerotic calcification. No pericardial effusion. Mediastinum/Nodes: No enlarged mediastinal, hilar, or axillary lymph nodes. Thyroid  gland, trachea, and esophagus demonstrate no significant findings. Lungs/Pleura: Trace bilateral pleural effusions.  Posterior lower lobe atelectasis or pneumonia. Bronchial wall thickening and occlusive debris throughout the left lower lobe bronchi. No pneumothorax. Musculoskeletal: Mildly displaced fractures of the right ninth and eighth ribs at the costochondral junction. Mildly displaced fractures of the posterolateral right tenth and eleventh ribs. Nondisplaced fracture of the posterior right twelfth rib. CT ABDOMEN PELVIS FINDINGS Hepatobiliary: No focal liver abnormality is seen. No gallstones, gallbladder wall thickening, or biliary dilatation. Pancreas: Unremarkable. Spleen: Unremarkable. Adrenals/Urinary Tract: Normal adrenal glands. No urinary calculi or hydronephrosis. Mild bladder wall thickening of the nondistended bladder. Stomach/Bowel: Normal caliber large and small bowel. Colonic diverticulosis without diverticulitis. Stomach is within normal limits. No bowel wall thickening. Normal appendix. Moderate stool in the rectum. Vascular/Lymphatic: Aortic atherosclerosis. No enlarged abdominal or pelvic lymph nodes. Reproductive: Enlarged prostate indenting the floor of the bladder. Other: No free intraperitoneal fluid or air. Musculoskeletal: No acute fracture or destructive osseous lesion. IMPRESSION: 1. Trace bilateral pleural effusions. Posterior lower lobe atelectasis or pneumonia. Bronchial wall thickening and occlusive debris throughout the left lower lobe bronchi favors aspiration. 2. Mildly displaced fractures of the right eighth through twelfth ribs. No evidence of underlying pathologic lesion. 3. Enlarged prostate indenting the floor of the bladder. Mild bladder wall thickening may be due to chronic outlet obstruction. 4. Aortic Atherosclerosis (ICD10-I70.0). Electronically Signed   By: Rozell Cornet M.D.   On: 03/26/2024 23:01   MR Brain W and Wo Contrast Result Date: 03/25/2024 CLINICAL DATA:  Cerebral edema on CT, recent fall. EXAM: MRI HEAD WITHOUT AND WITH CONTRAST TECHNIQUE: Multiplanar, multiecho  pulse sequences of the brain and surrounding structures were obtained without and with intravenous contrast. CONTRAST:  7mL GADAVIST GADOBUTROL 1 MMOL/ML IV SOLN COMPARISON:  Earlier same day head CT. FINDINGS: Brain: 1.1 x 1.0 x 1.3 cm nodular enhancement in the left basal ganglia involving the caudate nucleus with surrounding edema throughout the basal ganglia extending into the left temporal white matter. There is an additional focus of nodular enhancement in the left external capsule measures 1.2 x 1.0 x 1.5 cm. There is additional gyral enhancement within the anterior and medial left temporal lobe, irregular and gyral enhancement in the inferolateral left frontal lobe, and gyral enhancement along the inferior aspect of the left frontal lobe. There is edema throughout the left temporal lobe with associated mass effect on the left temporal horn. Additional edema noted in the ventral aspect of both thalami. No significant midline shift. Enhancement also noted extending into the left hypothalamus extending into the tuber cinereum with small focus of enhancement extending into the right hypothalamus observed on coronal series 15 image 14. No acute infarct. No susceptibility weighted images due to patient discomfort. T2/FLAIR hyperintensity in the periventricular and subcortical white matter likely reflecting chronic microvascular ischemic changes. Posterior fossa is unremarkable. The basilar cisterns are patent. No extra-axial fluid collections. Ventricles: Prominence of the lateral ventricles suggestive of underlying parenchymal volume loss. Partial effacement of  the left temporal horn. Vascular: Skull base flow voids are visualized. Skull and upper cervical spine: No focal abnormality. Sinuses/Orbits: Orbits are symmetric. Mucosal thickening in the left ethmoid sinus. Other: Small left mastoid effusion. IMPRESSION: Nodular enhancement in the left basal ganglia and left external capsule as well as areas of gyral  enhancement in the anterior left temporal lobe and inferior left frontal lobe. Additional enhancement extending into the left hypothalamus involving the tuber cinereum with small focus of enhancement extending into the right hypothalamus. 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 Additional considerations include metastatic disease, limbic/autoimmune encephalitis, herpes encephalitis among other etiologies. Recommend neurosurgical consultation and correlation with lumbar puncture. Edema throughout the left temporal lobe extending into the external capsule and basal ganglia with associated mass effect and partial effacement of the left temporal horn. No midline shift. No evidence of infarct. Mild chronic microvascular ischemic changes and parenchymal volume loss. Electronically Signed   By: Denny Flack M.D.   On: 03/25/2024 18:56   MR Cervical Spine Wo Contrast Result Date: 03/25/2024 CLINICAL DATA:  Cervical spine fracture EXAM: MRI CERVICAL SPINE WITHOUT CONTRAST TECHNIQUE: Multiplanar, multisequence MR imaging of the cervical spine was performed. No intravenous contrast was administered. COMPARISON:  03/25/2024 CT scan FINDINGS: Alignment: No vertebral subluxation is observed. Vertebrae: Acute fracture of the left posterior margin of the C5 spinous process with associated marrow edema, the fractures shown on image 17 series 12. Acute fracture of the spinous process of C6 centered about 1 cm from the posterior tip as shown on 22 series 12, with associated local marrow edema. Edema tracks along the adjacent interspinous ligaments especially at C5-6. Edema also extends along the interspinous ligament at C6-7. Facet edema on the left at C3-4 is likely degenerative on image 15 series 11. Severe degenerative findings are present at this level. Fused left facet joint at C4-5. Mild degenerative endosteal edema along the right facet joint at C3-4. Type 1 degenerative endplate findings at C6-7. Disc desiccation  throughout the cervical spine with loss of disc height most notable at C5-6 and C6-7. Cord: No significant abnormal spinal cord signal is observed. Posterior Fossa, vertebral arteries, paraspinal tissues: As noted above, there is edema along the interspinous ligaments at C5-6 and C6-7 potentially reflecting ligamentous injury. There is mild but abnormal edema tracking along the anterior longitudinal ligament extending from about C3 through C6. Disc levels: C2-3: No impingement.  Right uncinate and facet spurring noted. C3-4: Moderate to prominent bilateral foraminal stenosis due to disc bulge, uncinate spurring, and facet arthropathy. C4-5: No impingement. Fused and somewhat degenerated left facet joint. C5-6: Mild to moderate left foraminal stenosis due to uncinate spurring and mild facet arthropathy. C6-7: Moderate bilateral foraminal stenosis and mild central narrowing of the thecal sac due to disc bulge, uncinate spurring, and facet arthropathy. C7-T1: Unremarkable IMPRESSION: 1. Acute fractures of the spinous processes of C5 and C6, with associated marrow edema. 2. Edema tracks along the interspinous ligaments at C5-6 and C6-7, potentially reflecting ligamentous injury. 3. Mild but abnormal edema tracking along the anterior longitudinal ligament extending from about C3 through C6, potentially reflecting ligamentous injury. 4. Cervical spondylosis and degenerative disc disease, causing moderate to prominent impingement at C3-4; moderate impingement at C6-7; and mild to moderate impingement at C5-6. Electronically Signed   By: Freida Jes M.D.   On: 03/25/2024 18:41   CT HEAD WO CONTRAST Result Date: 03/25/2024 CLINICAL DATA:  Head trauma, multiple falls, weakness while walking up stairs. EXAM: CT HEAD WITHOUT CONTRAST  CT CERVICAL SPINE WITHOUT CONTRAST TECHNIQUE: Multidetector CT imaging of the head and cervical spine was performed following the standard protocol without intravenous contrast.  Multiplanar CT image reconstructions of the cervical spine were also generated. RADIATION DOSE REDUCTION: This exam was performed according to the departmental dose-optimization program which includes automated exposure control, adjustment of the mA and/or kV according to patient size and/or use of iterative reconstruction technique. COMPARISON:  MRI head 09/23/2021. FINDINGS: CT HEAD FINDINGS Brain: No acute intracranial hemorrhage. Hypoattenuation in the left temporal lobe without evidence of volume loss concerning for vasogenic edema. No significant midline shift. Mild parenchymal volume loss. The basilar cisterns are patent. Ventricles: There is mild mass effect on the left temporal horn. No hydrocephalus. Vascular: No hyperdense vessel or unexpected calcification. Skull: No acute or aggressive finding. Orbits: Orbits are symmetric. Sinuses: Mucosal thickening in the ethmoid sinuses. Other: Trace fluid in the left mastoid tip. CT CERVICAL SPINE FINDINGS Alignment: Straightening of the normal cervical lordosis. No significant listhesis. No facet subluxation or dislocation. Skull base and vertebrae: Mildly displaced and angulated fracture of C6 spinous process. There is an additional fracture of the C4 spinous process with well corticated margins. No compression fracture. No suspicious osseous lesion. Soft tissues and spinal canal: No prevertebral fluid or swelling. No visible canal hematoma. Disc levels: Moderate disc space narrowing at C5-6 and C6-7. Disc osteophyte complexes at multiple levels. No high-grade osseous spinal canal stenosis. Facet arthrosis at multiple levels foraminal narrowing is most pronounced at C3-4 and on the right at C6-7. Upper chest: Negative. Other: None. IMPRESSION: Vasogenic edema in the left temporal lobe with mild mass effect on the left temporal horn. Recommend MRI head with without contrast for further evaluation. No midline shift. Displaced and slightly angulated fracture of the  C6 spinous process and additional more chronic appearing fracture of the C4 spinous process. Recommend correlation with tenderness at these levels and consider MRI cervical spine for further evaluation. Chronic and degenerative changes as above. Electronically Signed   By: Denny Flack M.D.   On: 03/25/2024 16:21   CT CERVICAL SPINE WO CONTRAST Result Date: 03/25/2024 CLINICAL DATA:  Head trauma, multiple falls, weakness while walking up stairs. EXAM: CT HEAD WITHOUT CONTRAST CT CERVICAL SPINE WITHOUT CONTRAST TECHNIQUE: Multidetector CT imaging of the head and cervical spine was performed following the standard protocol without intravenous contrast. Multiplanar CT image reconstructions of the cervical spine were also generated. RADIATION DOSE REDUCTION: This exam was performed according to the departmental dose-optimization program which includes automated exposure control, adjustment of the mA and/or kV according to patient size and/or use of iterative reconstruction technique. COMPARISON:  MRI head 09/23/2021. FINDINGS: CT HEAD FINDINGS Brain: No acute intracranial hemorrhage. Hypoattenuation in the left temporal lobe without evidence of volume loss concerning for vasogenic edema. No significant midline shift. Mild parenchymal volume loss. The basilar cisterns are patent. Ventricles: There is mild mass effect on the left temporal horn. No hydrocephalus. Vascular: No hyperdense vessel or unexpected calcification. Skull: No acute or aggressive finding. Orbits: Orbits are symmetric. Sinuses: Mucosal thickening in the ethmoid sinuses. Other: Trace fluid in the left mastoid tip. CT CERVICAL SPINE FINDINGS Alignment: Straightening of the normal cervical lordosis. No significant listhesis. No facet subluxation or dislocation. Skull base and vertebrae: Mildly displaced and angulated fracture of C6 spinous process. There is an additional fracture of the C4 spinous process with well corticated margins. No compression  fracture. No suspicious osseous lesion. Soft tissues and spinal canal: No prevertebral fluid  or swelling. No visible canal hematoma. Disc levels: Moderate disc space narrowing at C5-6 and C6-7. Disc osteophyte complexes at multiple levels. No high-grade osseous spinal canal stenosis. Facet arthrosis at multiple levels foraminal narrowing is most pronounced at C3-4 and on the right at C6-7. Upper chest: Negative. Other: None. IMPRESSION: Vasogenic edema in the left temporal lobe with mild mass effect on the left temporal horn. Recommend MRI head with without contrast for further evaluation. No midline shift. Displaced and slightly angulated fracture of the C6 spinous process and additional more chronic appearing fracture of the C4 spinous process. Recommend correlation with tenderness at these levels and consider MRI cervical spine for further evaluation. Chronic and degenerative changes as above. Electronically Signed   By: Denny Flack M.D.   On: 03/25/2024 16:21   DG Chest Port 1 View Result Date: 03/25/2024 CLINICAL DATA:  weak EXAM: PORTABLE CHEST - 1 VIEW COMPARISON:  Mar 15, 2021 FINDINGS: No focal airspace consolidation or pleural effusion. No cardiomegaly. Tortuous aorta with aortic atherosclerosis. Multilevel thoracic osteophytosis. Apparent lucency overlying the lateral left seventh rib. IMPRESSION: 1. Apparent lucency overlying the lateral left seventh rib, likely artifactual from overlapping ribs and material external to the patient. No displaced rib fractures or visualized pneumothoraces. 2. No pneumonia or pulmonary edema. Electronically Signed   By: Rance Burrows M.D.   On: 03/25/2024 16:03   DG Pelvis Portable Result Date: 03/25/2024 CLINICAL DATA:  Fall.  Weakness. EXAM: PORTABLE PELVIS 1-2 VIEWS COMPARISON:  None CT pelvis 03/07/2006 FINDINGS: Mild bilateral sacroiliac subchondral sclerosis. Moderate bilateral superior femoroacetabular joint space narrowing. Small chronic ossicles at the  superolateral aspect of each femoroacetabular joint. The pubic symphysis joint space is maintained. No acute fracture is seen. No dislocation. IMPRESSION: 1. Moderate bilateral femoroacetabular osteoarthritis. 2. No acute fracture is seen. Electronically Signed   By: Bertina Broccoli M.D.   On: 03/25/2024 16:03        Scheduled Meds:  diltiazem  180 mg Oral Daily   donepezil  10 mg Oral Daily   escitalopram  5 mg Oral Daily   metoprolol succinate  50 mg Oral Daily   mirtazapine  15 mg Oral QHS   potassium chloride  40 mEq Oral Once   Continuous Infusions:   LOS: 2 days    Time spent: 35 minutes    Othar Curto A Chonte Ricke, MD [facility]   If 7PM-7AM, please contact night-coverage [email or web address]  03/27/2024, 7:51 AM

## 2024-03-27 NOTE — Progress Notes (Signed)
 MD Regalado requesting all radiology imaging be sent to [facility] via [facility]. Radiology called. Per radiology, will send imaging to [facility] hospital.

## 2024-03-27 NOTE — Progress Notes (Signed)
 Routine EEG completed, results pending Neurology review and interpretation

## 2024-03-28 DIAGNOSIS — G939 Disorder of brain, unspecified: Secondary | ICD-10-CM | POA: Diagnosis not present

## 2024-03-28 DIAGNOSIS — D496 Neoplasm of unspecified behavior of brain: Secondary | ICD-10-CM | POA: Diagnosis not present

## 2024-03-28 DIAGNOSIS — R296 Repeated falls: Secondary | ICD-10-CM | POA: Diagnosis not present

## 2024-03-28 DIAGNOSIS — G20A1 Parkinson's disease without dyskinesia, without mention of fluctuations: Secondary | ICD-10-CM | POA: Diagnosis not present

## 2024-03-28 LAB — BASIC METABOLIC PANEL WITH GFR
Anion gap: 10 (ref 5–15)
BUN: 7 mg/dL — ABNORMAL LOW (ref 8–23)
CO2: 26 mmol/L (ref 22–32)
Calcium: 8.4 mg/dL — ABNORMAL LOW (ref 8.9–10.3)
Chloride: 99 mmol/L (ref 98–111)
Creatinine, Ser: 0.73 mg/dL (ref 0.61–1.24)
GFR, Estimated: >60 mL/min (ref >60)
Glucose, Bld: 99 mg/dL (ref 70–99)
Potassium: 4.1 mmol/L (ref 3.5–5.1)
Sodium: 135 mmol/L (ref 135–145)

## 2024-03-28 LAB — CBC
HCT: 37.8 % — ABNORMAL LOW (ref 39.0–52.0)
Hemoglobin: 12.7 g/dL — ABNORMAL LOW (ref 13.0–17.0)
MCH: 29.2 pg (ref 26.0–34.0)
MCHC: 33.6 g/dL (ref 30.0–36.0)
MCV: 86.9 fL (ref 80.0–100.0)
Platelets: 322 10*3/uL (ref 150–400)
RBC: 4.35 MIL/uL (ref 4.22–5.81)
RDW: 11.7 % (ref 11.5–15.5)
WBC: 8.5 10*3/uL (ref 4.0–10.5)
nRBC: 0 % (ref 0.0–0.2)

## 2024-03-28 LAB — GLUCOSE, CAPILLARY
Glucose-Capillary: 113 mg/dL — ABNORMAL HIGH (ref 70–99)
Glucose-Capillary: 100 mg/dL — ABNORMAL HIGH (ref 70–99)

## 2024-03-28 MED ORDER — DEXTROSE IN LACTATED RINGERS 5 % IV SOLN: INTRAVENOUS | Status: AC

## 2024-03-28 NOTE — Plan of Care (Signed)
 Palliative-   Consult received, chart reviewed. Called patient's spouse, left message requesting return call.   Micki Alas, AGNP-C Palliative Medicine  No charge

## 2024-03-28 NOTE — Care Management Important Message (Signed)
 Important Message  Patient Details  Name: Steven Gibbs MRN: [id number] Date of Birth: 1946/01/05   Important Message Given:        [name] 03/28/2024, 3:26 PM

## 2024-03-28 NOTE — Progress Notes (Addendum)
 PROGRESS NOTE    Steven Gibbs  ZOX:[id number] DOB: 11/16/1945 DOA: 03/25/2024 PCP: Artemisa Bile, MD   Brief Narrative: 78 year old with past medical history significant for Parkinson dementia, hypertension present weakness and fall.  Patient fell on the floor able to get him on the slip on the floor.  Next day he was complaining of pain.  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  Lumbar puncture performed by EDP on 6/10 with no concerning findings.  Transferred to [facility] for further evaluation  At baseline patient is sometimes able to recognize his wife, sometimes will answer simple questions, over the past 6 weeks patient had decline and has had multiple falls.  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 He has  baseline dementia, Parkinson's disease.  - MRI brain: Nodular enhancement left basal ganglia, left external capsule, area of gyral enhancement anterior left temporal and inferior frontal lobe, left hypothalamus.  -Neurosurgery evaluated patient, recommend neurology evaluation as well as workup.  These lesions are not amenable to  surgical resection. - 6/10 Underwent  LP: CSF: White blood cell 2: Lymphocyte 85% meningitis encephalitis panel negative, obtain 76. Lymphocyte flow cytology pending.  -EEG: Continuous slow generalized.  -Neurology consulted. Can stop Acyclovir .  -CT Chest , abdomen-pelvis: trace bilateral pleural effusion, posterior lower lobe atelectasis or PNA, Bronchia wall thickening.  Enlarged prostate indenting the floor of the bladder. Mild bladder wall thickening may be due to chronic outlet obstruction. Aortic Atherosclerosis   -There was not enough CSF fluid for cytology, VDRL and fungal culture not sent. Discussed with Dr Renaee Caro, no need to repeat LP; cytology is usually low yield, Syphilis  and fungal  less likely. He think patient is more palliative care, this could be inflammatory or Malignancy.  -palliative care consulted.   Aspiration PNA;  CT chest: Posterior lower lobe atelectasis or pneumonia. Bronchial wall thickening and occlusive debris throughout the left lower lobe bronchi favors aspiration. -Continue with  Unasyn     Fracture of the cervical spinous process Neurosurgery recommend C collar.   Essential hypertension Continue Cardizem  and metoprolol .   Dementia, Parkinson's disease Continue with Aricept , Lexapro , Remeron .   Mildly displaced fractures of the right eighth through twelfth ribs. No evidence of underlying pathologic lesion. -Incentive spirometry  Estimated body mass index is 23.13 kg/m as calculated from the following:   Height as of this encounter: 5' 8 (1.727 m).   Weight as of this encounter: 69 kg.   DVT prophylaxis: SCD Code Status: Discussed with wife, medical condition and concern for malignancy s encephalopathy. Plan for palliative care consult. She agrees with DNR, DNI Family Communication: care discussed with wife who was at bedside.  Disposition Plan:  Status is: Inpatient Remains inpatient appropriate because: management of brain lesion, encephalopathy     Consultants:  Neurology Neurosurgery   Procedures:  LP 6/10  Antimicrobials:    Subjective: Sleepy, keep eyes close, passive movement notice.   Objective: Vitals:   03/27/24 2350 03/28/24 0407 03/28/24 0751 03/28/24 1144  BP: 130/72 124/72 127/71 109/63  Pulse: 73 78 93 71  Resp: 16 18 16 14   Temp: 99 F (37.2 C) 99.1 F (37.3 C) 98.2  F (36.8 C) 98.4 F (36.9 C)  TempSrc: Oral Oral Oral Oral  SpO2: 96% 95% 97% 95%  Weight:      Height:        Intake/Output Summary (Last 24 hours) at  03/28/2024 1150 Last data filed at 03/28/2024 0630 Gross per 24 hour  Intake 216.24 ml  Output 800 ml  Net -583.76 ml   Filed Weights   03/25/24 1514  Weight: 69 kg    Examination:  General exam: Sleepy cervical collar Respiratory system: CTA Cardiovascular system: S 1, S 2 RRR Gastrointestinal system: BS present, soft, nt Central nervous system: sleepy, lethargic Extremities: no edema S  Data Reviewed: I have personally reviewed following labs and imaging studies  CBC: Recent Labs  Lab 03/25/24 1539 03/26/24 0455 03/27/24 0553  WBC 8.4 10.1 9.7  NEUTROABS 6.6  --  7.8*  HGB 13.7 13.1 14.0  HCT 41.0 40.2 41.5  MCV 89.7 88.5 87.7  PLT 353 293 336   Basic Metabolic Panel: Recent Labs  Lab 03/25/24 1539 03/26/24 0455 03/27/24 0553  NA 137 137 135  K 3.5 3.8 3.7  CL 100 100 98  CO2 28 27 29   GLUCOSE 96 96 96  BUN 12 10 9   CREATININE 0.63 0.66 0.74  CALCIUM 8.8* 8.7* 8.8*  MG  --   --  2.0   GFR: Estimated Creatinine Clearance: 77.2 mL/min (by C-G formula based on SCr of 0.74 mg/dL). Liver Function Tests: Recent Labs  Lab 03/25/24 1539 03/27/24 0553  AST 35 30  ALT 16 16  ALKPHOS 94 93  BILITOT 0.9 1.0  PROT 6.0* 5.9*  ALBUMIN 3.1* 2.8*   No results for input(s): LIPASE, AMYLASE in the last 168 hours. No results for input(s): AMMONIA in the last 168 hours. Coagulation Profile: No results for input(s): INR, PROTIME in the last 168 hours. Cardiac Enzymes: Recent Labs  Lab 03/25/24 1539  CKTOTAL 1,556*   BNP (last 3 results) No results for input(s): PROBNP in the last 8760 hours. HbA1C: No results for input(s): HGBA1C in the last 72 hours. CBG: Recent Labs  Lab 03/27/24 1123 03/27/24 1747 03/27/24 2351 03/28/24 0718 03/28/24 1146  GLUCAP 96 124* 109* 113* 100*   Lipid Profile: No results for input(s): CHOL, HDL, LDLCALC, TRIG, CHOLHDL, LDLDIRECT in the last 72 hours. Thyroid  Function Tests: No results for  input(s): TSH, T4TOTAL, FREET4, T3FREE, THYROIDAB in the last 72 hours. Anemia Panel: No results for input(s): VITAMINB12, FOLATE, FERRITIN, TIBC, IRON, RETICCTPCT in the last 72 hours. Sepsis Labs: No results for input(s): PROCALCITON, LATICACIDVEN in the last 168 hours.  Recent Results (from the past 240 hours)  CSF culture w Gram Stain     Status: None (Preliminary result)   Collection Time: 03/25/24 11:35 PM   Specimen: CSF; Cerebrospinal Fluid  Result Value Ref Range Status   Specimen Description   Final    CSF Performed at [facility], [address], [place], [place] [address]    Special Requests   Final    NONE Performed at [facility], [address] [place], [place] [address]    Gram Stain   Final    CYTOSPIN SMEAR WBC PRESENT, PREDOMINANTLY MONONUCLEAR NO ORGANISMS SEEN    Culture   Final    NO GROWTH 2 DAYS Performed at [facility], [facility], [address] [place], [place] [address]    Report Status PENDING  Incomplete         Radiology Studies: EEG adult  Result Date: 03/27/2024 Arleene Lack, MD     03/27/2024 12:29 PM Patient Name: Steven Gibbs MRN: [id number] Epilepsy Attending: Arleene Lack Referring Physician/Provider: Angelene Kelly, MD Date: 03/27/2024 Duration: 29.41 mins Patient history:  78 yo male with unusual lesion of the left anterior temporal lobe, basal ganglia, bilateral hypothalamus. EEG to evaluate for seizure Level of alertness: Awake AEDs during EEG study: None Technical aspects: This EEG study was done with scalp electrodes positioned according to the 10-20 International system of electrode placement. Electrical activity was reviewed with band pass filter of 1-70Hz , sensitivity of 7 uV/mm, display speed of 19mm/sec with a 60Hz  notched filter applied as appropriate. EEG data were recorded continuously and digitally stored.  Video monitoring was available and reviewed as appropriate. Description: EEG showed  continuous generalized 3 to 6 Hz theta-delta slowing. Hyperventilation and photic stimulation were not performed.   ABNORMALITY - Continuous slow, generalized IMPRESSION: This study is suggestive of moderate diffuse encephalopathy. No seizures or epileptiform discharges were seen throughout the recording. Arleene Lack   CT CHEST ABDOMEN PELVIS W CONTRAST Result Date: 03/26/2024 CLINICAL DATA:  Staging metastasis for musculoskeletal neoplasm. EXAM: CT CHEST, ABDOMEN, AND PELVIS WITH CONTRAST TECHNIQUE: Multidetector CT imaging of the chest, abdomen and pelvis was performed following the standard protocol during bolus administration of intravenous contrast. RADIATION DOSE REDUCTION: This exam was performed according to the departmental dose-optimization program which includes automated exposure control, adjustment of the mA and/or kV according to patient size and/or use of iterative reconstruction technique. CONTRAST:  60mL OMNIPAQUE  IOHEXOL  350 MG/ML SOLN COMPARISON:  Pelvis and chest radiograph 03/25/2024 FINDINGS: CT CHEST FINDINGS Cardiovascular: Normal caliber thoracic aorta. Aortic and coronary artery atherosclerotic calcification. No pericardial effusion. Mediastinum/Nodes: No enlarged mediastinal, hilar, or axillary lymph nodes. Thyroid  gland, trachea, and esophagus demonstrate no significant findings. Lungs/Pleura: Trace bilateral pleural effusions. Posterior lower lobe atelectasis or pneumonia. Bronchial wall thickening and occlusive debris throughout the left lower lobe bronchi. No pneumothorax. Musculoskeletal: Mildly displaced fractures of the right ninth and eighth ribs at the costochondral junction. Mildly displaced fractures of the posterolateral right tenth and eleventh ribs. Nondisplaced fracture of the posterior right twelfth rib. CT ABDOMEN PELVIS FINDINGS Hepatobiliary: No focal liver abnormality is seen. No gallstones, gallbladder wall thickening, or biliary dilatation. Pancreas:  Unremarkable. Spleen: Unremarkable. Adrenals/Urinary Tract: Normal adrenal glands. No urinary calculi or hydronephrosis. Mild bladder wall thickening of the nondistended bladder. Stomach/Bowel: Normal caliber large and small bowel. Colonic diverticulosis without diverticulitis. Stomach is within normal limits. No bowel wall thickening. Normal appendix. Moderate stool in the rectum. Vascular/Lymphatic: Aortic atherosclerosis. No enlarged abdominal or pelvic lymph nodes. Reproductive: Enlarged prostate indenting the floor of the bladder. Other: No free intraperitoneal fluid or air. Musculoskeletal: No acute fracture or destructive osseous lesion. IMPRESSION: 1. Trace bilateral pleural effusions. Posterior lower lobe atelectasis or pneumonia. Bronchial wall thickening and occlusive debris throughout the left lower lobe bronchi favors aspiration. 2. Mildly displaced fractures of the right eighth through twelfth ribs. No evidence of underlying pathologic lesion. 3. Enlarged prostate indenting the floor of the bladder. Mild bladder wall thickening may be due to chronic outlet obstruction. 4. Aortic Atherosclerosis (ICD10-I70.0). Electronically Signed   By: Rozell Cornet M.D.   On: 03/26/2024 23:01        Scheduled Meds:  diltiazem   180 mg Oral Daily   donepezil   10 mg Oral Daily   escitalopram   5 mg Oral Daily   metoprolol  succinate  50 mg Oral Daily  mirtazapine   15 mg Oral QHS   potassium chloride   40 mEq Oral Once   Continuous Infusions:  ampicillin -sulbactam (UNASYN ) IV 3 g (03/28/24 0631)     LOS: 3 days    Time spent: 35 minutes    Ori Trejos A Miquel Stacks, MD [facility]   If 7PM-7AM, please contact night-coverage [email or web address]  03/28/2024, 11:50 AM

## 2024-03-28 NOTE — Plan of Care (Signed)
  Problem: Clinical Measurements: Goal: Respiratory complications will improve Outcome: Progressing   Problem: Activity: Goal: Risk for activity intolerance will decrease Outcome: Progressing   Problem: Nutrition: Goal: Adequate nutrition will be maintained Outcome: Progressing   Problem: Coping: Goal: Level of anxiety will decrease Outcome: Progressing   Problem: Pain Managment: Goal: General experience of comfort will improve and/or be controlled Outcome: Progressing   Problem: Safety: Goal: Ability to remain free from injury will improve Outcome: Progressing   Problem: Skin Integrity: Goal: Risk for impaired skin integrity will decrease Outcome: Progressing

## 2024-03-28 NOTE — Evaluation (Signed)
 Occupational Therapy Evaluation Patient Details Name: Steven Gibbs MRN: [id number] DOB: 1946-01-23 Today's Date: 03/28/2024   History of Present Illness   Pt is a 78 yo male presenting to [facility] on 03/25/24 for weakness and falls, MRI determined brain lesion of the L temporal and frontal lobes expanding to the BG. Also found to have acute fractures at C5 and C6 spinous processes. PMH of dementia, Parkinsons, HTN, hx of falls, cervical fx.     Clinical Impressions Pt admitted for above, PTA pt spouse reports having to assist him with ADLs and pt was mostly w/c level but able to use stairs. Pt currently limited by cognition, not following commands and requiring Total A to Total A +2 for all aspects of ADLs and mobility. Pt often just stares, may look R but not following visual stimuli. OT to continue following pt while in acute setting to progress pt as able. Patient will benefit from continued inpatient follow up therapy, <3 hours/day      If plan is discharge home, recommend the following:   Two people to help with walking and/or transfers;Two people to help with bathing/dressing/bathroom;Direct supervision/assist for medications management;Assistance with cooking/housework;Direct supervision/assist for financial management;Assistance with feeding;Assist for transportation;Supervision due to cognitive status     Functional Status Assessment   Patient has had a recent decline in their functional status and/or demonstrates limited ability to make significant improvements in function in a reasonable and predictable amount of time     Equipment Recommendations   Other (comment) (defer)     Recommendations for Other Services         Precautions/Restrictions   Precautions Precautions: Fall Recall of Precautions/Restrictions: Impaired Precaution/Restrictions Comments: hx of Parkinson's and Dementia Required Braces or Orthoses: Cervical Brace Cervical Brace: Hard collar;At all  times (not in order set, Cspine fxs) Restrictions Weight Bearing Restrictions Per Provider Order: No     Mobility Bed Mobility Overal bed mobility: Needs Assistance Bed Mobility: Supine to Sit, Sit to Sidelying     Supine to sit: Total assist, +2 for safety/equipment, +2 for physical assistance   Sit to sidelying: +2 for safety/equipment, Total assist, +2 for physical assistance General bed mobility comments: helicopter method.    Transfers Overall transfer level: Needs assistance   Transfers: Sit to/from Stand Sit to Stand: +2 safety/equipment, +2 physical assistance, Total assist           General transfer comment: Pt making efforts to reposition and stand on his own, required Total A +2 to stand and maintain balance but pt trying to perform steps to [facility]      Balance Overall balance assessment: Needs assistance Sitting-balance support: Feet supported Sitting balance-Leahy Scale: Poor Sitting balance - Comments: brief period of Min A but grossly mod A for static sitting. Postural control: Posterior lean Standing balance support: Bilateral upper extremity supported Standing balance-Leahy Scale: Zero Standing balance comment: reliant on therapists.                           ADL either performed or assessed with clinical judgement   ADL                                         General ADL Comments: Pt currently needing total A for ADLs, pt with little effort to reposition LUE to wash face despite hand over hand assist  but he was resisting.     Vision   Vision Assessment?: Vision impaired- to be further tested in functional context Additional Comments: Gaze fixated as if staring into space. does not attend to name call but will intermittently look to the R then back to midline at therapists.     Perception         Praxis Praxis: Impaired Praxis Impairment Details: Motor planning, Organization, Ideomotor, Initiation     Pertinent  Vitals/Pain Pain Assessment Pain Assessment: Faces Faces Pain Scale: Hurts even more Pain Location: Unspecified however pt grimaced when performing bed mobility. Pain Descriptors / Indicators: Grimacing, Moaning Pain Intervention(s): Monitored during session, Limited activity within patient's tolerance, Repositioned     Extremity/Trunk Assessment Upper Extremity Assessment Upper Extremity Assessment: Difficult to assess due to impaired cognition (PROM WFL, pt does have strong grip strength bilaterally, Raised LUE to neck when using washcloth.)   Lower Extremity Assessment Lower Extremity Assessment: Difficult to assess due to impaired cognition   Cervical / Trunk Assessment Cervical / Trunk Assessment: Other exceptions Cervical / Trunk Exceptions: Cervical Fxs   Communication Communication Communication: Impaired Factors Affecting Communication: Other (comment) (mostly non-verbal)   Cognition Arousal: Obtunded Behavior During Therapy: Flat affect Cognition: History of cognitive impairments             OT - Cognition Comments: hx of dementia, pt mostly non verbal and not following commands/prompts                 Following commands: Impaired Following commands impaired: Follows one step commands inconsistently (Maybe followed 1-2 commands from OT)     Cueing  General Comments   Cueing Techniques: Verbal cues;Gestural cues;Tactile cues;Visual cues  Pt spouse present and supportive   Exercises     Shoulder Instructions      Home Living Family/patient expects to be discharged to:: Private residence Living Arrangements: Spouse/significant other Available Help at Discharge: Family;Available 24 hours/day Type of Home: House Home Access: Stairs to enter [employer] of Steps: 2 Entrance Stairs-Rails: None Home Layout: Two level;Bed/bath upstairs;Able to live on main level with bedroom/bathroom Alternate Level Stairs-Number of Steps: 13 Alternate  Level Stairs-Rails: Left Bathroom Shower/Tub: Producer, television/film/video: Handicapped height Bathroom Accessibility: Yes   Home Equipment: Shower seat - built in;Toilet riser;Wheelchair - manual;Grab bars - tub/shower;Standard Warden/ranger Comments: 2 WCs one upstairs and one downstairs      Prior Functioning/Environment Prior Level of Function : Needs assist;History of Falls (last six months) (5-6 falls per wife)             Mobility Comments: Mostly WC level however able to perform stairs and ambulate short distances per wife. ADLs Comments: Wife performs all ADLs.    OT Problem List: Decreased strength;Decreased cognition;Impaired balance (sitting and/or standing);Decreased safety awareness   OT Treatment/Interventions: Self-care/ADL training;Visual/perceptual remediation/compensation;Therapeutic exercise;Balance training;DME and/or AE instruction;Therapeutic activities;Patient/family education      OT Goals(Current goals can be found in the care plan section)   Acute Rehab OT Goals Patient Stated Goal: To get better and return home OT Goal Formulation: With family Time For Goal Achievement: 04/11/24 Potential to Achieve Goals: Fair ADL Goals Pt Will Perform Eating: with mod assist;bed level Pt Will Perform Grooming: bed level;with min assist (and cues for initiation) Pt Will Transfer to Toilet: bedside commode;with mod assist;stand pivot transfer Additional ADL Goal #1: Pt will follow >50% of commands during therapy session to demonstrate improving engagement in functional activties. Additional ADL  Goal #3: Pt spouse will demonstrate independent use of pressure relief strategies to reduce pt risk of pressure wounds   OT Frequency:  Min 2X/week    Co-evaluation              AM-PAC OT 6 Clicks Daily Activity     Outcome Measure Help from another person eating meals?: Total Help from another person taking care of personal grooming?: Total Help  from another person toileting, which includes using toliet, bedpan, or urinal?: Total Help from another person bathing (including washing, rinsing, drying)?: Total Help from another person to put on and taking off regular upper body clothing?: Total Help from another person to put on and taking off regular lower body clothing?: Total 6 Click Score: 6   End of Session Equipment Utilized During Treatment: Gait belt Nurse Communication: Mobility status  Activity Tolerance: Patient limited by fatigue;Other (comment) (limited by cog) Patient left: in bed;with call bell/phone within reach;with bed alarm set;with family/visitor present  OT Visit Diagnosis: Other symptoms and signs involving cognitive function;Other abnormalities of gait and mobility (R26.89);Unsteadiness on feet (R26.81);Muscle weakness (generalized) (M62.81)                Time: 4098-1191 OT Time Calculation (min): 32 min Charges:  OT General Charges $OT Visit: 1 Visit OT Evaluation $OT Eval Moderate Complexity: 1 Mod OT Treatments $Therapeutic Activity: 8-22 mins  03/28/2024  AB, OTR/L  Acute Rehabilitation Services  Office: [phone]   Jorene New 03/28/2024, 1:16 PM

## 2024-03-28 NOTE — Progress Notes (Addendum)
 NEUROLOGY CONSULT FOLLOW UP NOTE   Date of service: March 28, 2024 Patient Name: Steven Gibbs MRN:  [id number] DOB:  1946-07-30  Interval Hx/subjective  Lying in bed with C-collar on. Does not open eyes to voice or light sternal rub today. Murmurs incomprehensibly. Wife is not at the bedside.   Vitals   Vitals:   03/27/24 1953 03/27/24 2350 03/28/24 0407 03/28/24 0751  BP: 127/73 130/72 124/72 127/71  Pulse: 75 73 78 93  Resp: 16 16 18 16   Temp: 98.5 F (36.9 C) 99 F (37.2 C) 99.1 F (37.3 C) 98.2 F (36.8 C)  TempSrc: Oral Oral Oral Oral  SpO2: 97% 96% 95% 97%  Weight:      Height:         Body mass index is 23.13 kg/m.  Physical Exam   Constitutional: Appears well-developed and well-nourished.  Eyes: No scleral injection.  HENT: No OP obstrucion. C-collar is in place Head: Normocephalic.  Respiratory: Effort normal, non-labored breathing.    Neurologic Examination   Mental Status: Lethargic to obtunded. Keeps eyes closed even after light sternal rub and calling of his name for 10 seconds. Only speech output is incomprehensible murmuring in response to sternal rub. Does not follow any commands. Will move his arms semipurposefully towards chest in response to sternal rub.   Cranial Nerves: II: PERRL. No blink to threat with eyelids passively elevated.    III,IV, VI: Eyes are conjugate and near the midline.   V: Reacts to eyelid stimulation VII: Face symmetric VIII: Unable to assess. Not responding to voice.  IX,X: Gag reflex deferred Motor/Sensory: RUE and LUE will both move towards chest during sternal rub. Both react to light pinch stimuli. Not following commands for formal assessment. Bilateral lower extremities move to pinch.  Tone in LLE is normal. Tone in RLE is increased.  Deep Tendon Reflexes: 2+ and symmetric throughout Cerebellar: Unable to assess Gait: Unable to assess  Medications  Current Facility-Administered Medications:    acetaminophen   (TYLENOL ) tablet 650 mg, 650 mg, Oral, Q6H PRN **OR** acetaminophen  (TYLENOL ) suppository 650 mg, 650 mg, Rectal, Q6H PRN, Emokpae, Ejiroghene E, MD   acyclovir  (ZOVIRAX ) 685 mg in dextrose  5 % 100 mL IVPB, 10 mg/kg (Ideal), Intravenous, Q8H, Pham, Minh Q, RPH-CPP, Last Rate: 113.7 mL/hr at 03/28/24 0608, 685 mg at 03/28/24 0608   Ampicillin -Sulbactam (UNASYN ) 3 g in sodium chloride  0.9 % 100 mL IVPB, 3 g, Intravenous, Q6H, Baldemar Lev, RPH, Last Rate: 200 mL/hr at 03/28/24 0631, 3 g at 03/28/24 0631   diltiazem  (CARDIZEM  CD) 24 hr capsule 180 mg, 180 mg, Oral, Daily, Emokpae, Ejiroghene E, MD, 180 mg at 03/26/24 1052   donepezil  (ARICEPT ) tablet 10 mg, 10 mg, Oral, Daily, Emokpae, Ejiroghene E, MD, 10 mg at 03/26/24 1051   escitalopram  (LEXAPRO ) tablet 5 mg, 5 mg, Oral, Daily, Emokpae, Ejiroghene E, MD, 5 mg at 03/26/24 1051   lactated ringers  infusion, , Intravenous, Continuous, Pham, Minh Q, RPH-CPP, Last Rate: 100 mL/hr at 03/28/24 0629, New Bag at 03/28/24 8295   metoprolol  succinate (TOPROL -XL) 24 hr tablet 50 mg, 50 mg, Oral, Daily, Emokpae, Ejiroghene E, MD, 50 mg at 03/26/24 1050   mirtazapine  (REMERON ) tablet 15 mg, 15 mg, Oral, QHS, Emokpae, Ejiroghene E, MD, 15 mg at 03/26/24 2148   ondansetron  (ZOFRAN ) tablet 4 mg, 4 mg, Oral, Q6H PRN **OR** ondansetron  (ZOFRAN ) injection 4 mg, 4 mg, Intravenous, Q6H PRN, Emokpae, Ejiroghene E, MD   polyethylene glycol (MIRALAX  / GLYCOLAX )  packet 17 g, 17 g, Oral, Daily PRN, Emokpae, Ejiroghene E, MD   potassium chloride  (KLOR-CON ) packet 40 mEq, 40 mEq, Oral, Once, Pati Bonine, MD  Labs and Diagnostic Imaging   CBC:  Recent Labs  Lab 03/25/24 1539 03/26/24 0455 03/27/24 0553  WBC 8.4 10.1 9.7  NEUTROABS 6.6  --  7.8*  HGB 13.7 13.1 14.0  HCT 41.0 40.2 41.5  MCV 89.7 88.5 87.7  PLT 353 293 336    Basic Metabolic Panel:  Lab Results  Component Value Date   NA 135 03/27/2024   K 3.7 03/27/2024   CO2 29 03/27/2024    GLUCOSE 96 03/27/2024   BUN 9 03/27/2024   CREATININE 0.74 03/27/2024   CALCIUM 8.8 (L) 03/27/2024   GFRNONAA >60 03/27/2024   MRI cervical spine: 1. Acute fractures of the spinous processes of C5 and C6, with associated marrow edema. 2. Edema tracks along the interspinous ligaments at C5-6 and C6-7, potentially reflecting ligamentous injury. 3. Mild but abnormal edema tracking along the anterior longitudinal ligament extending from about C3 through C6, potentially reflecting ligamentous injury. 4. Cervical spondylosis and degenerative disc disease, causing moderate to prominent impingement at C3-4; moderate impingement at C6-7; and mild to moderate impingement at C5-6.   MRI brain w/wo contrast:  - Nodular enhancement in the left basal ganglia and left external capsule as well as areas of gyral enhancement in the anterior left temporal lobe and inferior left frontal lobe. Additional enhancement extending into the left hypothalamus involving the tuber cinereum with small focus of enhancement extending into the right hypothalamus. 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 Additional considerations include metastatic disease, limbic/autoimmune encephalitis, herpes encephalitis among other etiologies. Recommend neurosurgical consultation and correlation with lumbar puncture. - Edema throughout the left temporal lobe extending into the external capsule and basal ganglia with associated mass effect and partial effacement of the left temporal horn. No midline shift. - No evidence of infarct. - Mild chronic microvascular ischemic changes and parenchymal volume loss.  CT of chest/abdomen/pelvis with contrast: 1. Trace bilateral pleural effusions. Posterior lower lobe atelectasis or pneumonia. Bronchial wall thickening and occlusive debris throughout the left lower lobe bronchi favors aspiration. 2. Mildly displaced fractures of the right eighth through twelfth ribs. No evidence  of underlying pathologic lesion. 3. Enlarged prostate indenting the floor of the bladder. Mild bladder wall thickening may be due to chronic outlet obstruction. 4. Aortic atherosclerosis   Assessment  Steven Gibbs is a 78 y.o. male presenting with multiple falls. MRI with and without contrast reveals an invasive-appearing, partially-enhancing lesion involving the left anterior temporal lobe, basal ganglia and bilateral hypothalamus. In the temporal lobe there is gyriform enhancement pattern suggestive of leptomeningeal involvement; nodular enhancement is also present within the brain parenchyma. 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 The DDx also includes viral or autoimmune encephalitis, vasculitis, metastatic disease, post-ischemic phenomenon, neurosarcoidosis, CNS lymphoma and PML.  - Exam reveals tremor and cognitive deficits.  - MRI brain findings are as noted above. 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 The appearance is suggestive of CNS lymphoma or astrocytoma, but other primary malignancies are also on the DDx.   - CT of C/A/P with contrast shows no evidence for a primary tumor within the imaged tissues. Given lack of evidence of a primary lung or visceral tumor, metastases are unlikely and brain biopsy would be the next step in the diagnostic process - Labs: - CBC is normal  without leukocytosis - BMP is unremarkable from a neurological standpoint.  - CSF: Clear, colorless, 210 RBC tube 1 >> 16 RBC tube 4, 1 WBC per microliter (normal), total protein elevated at 76. Meningitis/encephalitis PCR panel is pan-negative. Findings are not consistent with infection.  - CSF culture with no growth at 2 days.  - Impression:  - Left temporal lobe partially enhancing lesion with extension into the medial right frontal lobe and bilateral hypothalami. 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 Neurosarcoidosis is relatively low on the DDx.given  the appearance and his lack of pulmonary disease. PML is unlikely as he is not immunosuppressed. No fever or white count to suggest an infectious etiology, with CSF markers and negative meningitis/encephalitis PCR panel also not consistent with infection.  - Advanced Parkinson's dementia.  - The left temporal lobe lesion appears to have resulted in permanent damage of his left temporal lobe. The abnormal enhancement of his hypothalami looks most consistent with an inflammatory process versus malignancy. If we did get a tissue sample and it confirmed malignancy, I am not sure if there would be a treatment option whose risks were outweighed by benefits. If inflammatory such as neurosarcoidosis, steroid treatment could help. In any event, he would need a brain biopsy. LP is too low yield for the above DDx.      Recommendations  - Neurosurgery can be re-consulted to discuss risks/benefits of biopsy with patient's wife - Consider a Heme/Onc consult - Consider Palliative consult, as patient's wife expressed some reluctance yesterday regarding surgical interventions and given the patient's advanced Parkinson's dementia, there may be little improvement in his survival with a reasonable quality of life if aggressive treatment of his brain lesion was initiated.  - Agree with continuing mirtazapine , donepezil  and Lexapro  for his advanced Parkinson's dementia     ______________________________________________________________________   Hope Ly, Antaeus Karel, MD [facility]

## 2024-03-28 NOTE — Progress Notes (Signed)
 SLP Cancellation Note  Patient Details Name: Steven Gibbs MRN: [id number] DOB: 02/19/1946   Cancelled treatment:       Reason Eval/Treat Not Completed: Fatigue/lethargy limiting ability to participate. Updated physician and recommended Cortrak placement for medication and nutrition delivery. SLP will continue to follow.    Sandford Diop J Kyl Givler 03/28/2024, 7:54 AM

## 2024-03-28 NOTE — Evaluation (Signed)
 Physical Therapy Evaluation Patient Details Name: Steven Gibbs MRN: [id number] DOB: 07/11/1946 Today's Date: 03/28/2024  History of Present Illness  Pt is a 78 yo male presenting to [facility] on 03/25/24 for weakness and falls, MRI determined brain lesion of the L temporal and frontal lobes expanding to the BG. Also found to have acute fractures at C5 and C6 spinous processes. PMH of dementia, Parkinsons, HTN, hx of falls, cervical fx.  Clinical Impression  Pt presents with admitting diagnosis above. Co-treat with OT. Pt today was obtunded for majority of session requiring +2 total A for all mobility however was able to stand twice. PTA pt wife reports that he was mostly WC level however was able to navigate steps and ambulate short distances at times. Patient will benefit from continued inpatient follow up therapy, <3 hours/day. PT will continue to follow.         If plan is discharge home, recommend the following: Two people to help with walking and/or transfers;A lot of help with bathing/dressing/bathroom;Assistance with cooking/housework;Direct supervision/assist for medications management;Direct supervision/assist for financial management;Assist for transportation;Help with stairs or ramp for entrance;Supervision due to cognitive status   Can travel by private vehicle   No    Equipment Recommendations Hoyer lift;Hospital bed  Recommendations for Other Services       Functional Status Assessment Patient has had a recent decline in their functional status and demonstrates the ability to make significant improvements in function in a reasonable and predictable amount of time.     Precautions / Restrictions Precautions Precautions: Fall Recall of Precautions/Restrictions: Impaired Precaution/Restrictions Comments: hx of Parkinson's and Dementia Required Braces or Orthoses: Cervical Brace Cervical Brace: Hard collar;At all times (not in order set, Cspine fxs) Restrictions Weight Bearing  Restrictions Per Provider Order: No      Mobility  Bed Mobility Overal bed mobility: Needs Assistance Bed Mobility: Supine to Sit, Sit to Sidelying     Supine to sit: Total assist, +2 for safety/equipment, +2 for physical assistance   Sit to sidelying: +2 for safety/equipment, Total assist, +2 for physical assistance General bed mobility comments: helicopter method.    Transfers Overall transfer level: Needs assistance Equipment used: 2 person hand held assist Transfers: Sit to/from Stand Sit to Stand: +2 safety/equipment, +2 physical assistance, Total assist           General transfer comment: Pt making efforts to reposition and stand on his own, required Total A +2 to stand and maintain balance but pt trying to perform steps to [facility]    Ambulation/Gait               General Gait Details: Deferred for safety  Stairs            Wheelchair Mobility     Tilt Bed    Modified Rankin (Stroke Patients Only)       Balance Overall balance assessment: Needs assistance Sitting-balance support: Feet supported Sitting balance-Leahy Scale: Poor Sitting balance - Comments: brief period of Min A but grossly mod A for static sitting. Postural control: Posterior lean Standing balance support: Bilateral upper extremity supported Standing balance-Leahy Scale: Zero Standing balance comment: reliant on therapists.                             Pertinent Vitals/Pain Pain Assessment Pain Assessment: Faces Faces Pain Scale: Hurts even more Pain Location: Unspecified however pt grimaced when performing bed mobility. Pain Descriptors / Indicators: Grimacing, Moaning  Pain Intervention(s): Monitored during session, Limited activity within patient's tolerance    Home Living Family/patient expects to be discharged to:: Private residence Living Arrangements: Spouse/significant other Available Help at Discharge: Family;Available 24 hours/day Type of Home:  House Home Access: Stairs to enter Entrance Stairs-Rails: None Entrance Stairs-Number of Steps: 2 Alternate Level Stairs-Number of Steps: 13 Home Layout: Two level;Bed/bath upstairs;Able to live on main level with bedroom/bathroom Home Equipment: Shower seat - built in;Toilet riser;Wheelchair - manual;Grab bars - tub/shower;[employer] Comments: 2 WCs one upstairs and one downstairs    Prior Function Prior Level of Function : Needs assist;History of Falls (last six months) (5-6 falls per wife)             Mobility Comments: Mostly WC level however able to perform stairs and ambulate short distances per wife. ADLs Comments: Wife performs all ADLs.     Extremity/Trunk Assessment   Upper Extremity Assessment Upper Extremity Assessment: Defer to OT evaluation    Lower Extremity Assessment Lower Extremity Assessment: Difficult to assess due to impaired cognition    Cervical / Trunk Assessment Cervical / Trunk Assessment: Other exceptions Cervical / Trunk Exceptions: Cervical Fxs  Communication   Communication Communication: Impaired Factors Affecting Communication: Other (comment) (mostly non-verbal)    Cognition Arousal: Obtunded Behavior During Therapy: Flat affect                             Following commands: Impaired Following commands impaired: Follows one step commands inconsistently (Maybe followed 1-2 commands from OT)     Cueing Cueing Techniques: Verbal cues, Gestural cues, Tactile cues, Visual cues     General Comments General comments (skin integrity, edema, etc.): VSS    Exercises     Assessment/Plan    PT Assessment Patient needs continued PT services  PT Problem List Decreased strength;Decreased range of motion;Decreased activity tolerance;Decreased balance;Decreased mobility;Decreased coordination;Decreased cognition;Decreased knowledge of use of DME;Decreased safety awareness;Cardiopulmonary status limiting  activity;Decreased knowledge of precautions       PT Treatment Interventions DME instruction;Gait training;Functional mobility training;Stair training;Therapeutic activities;Therapeutic exercise;Balance training;Neuromuscular re-education;Patient/family education;Cognitive remediation    PT Goals (Current goals can be found in the Care Plan section)  Acute Rehab PT Goals PT Goal Formulation: Patient unable to participate in goal setting Time For Goal Achievement: 04/11/24 Potential to Achieve Goals: Poor    Frequency Min 2X/week     Co-evaluation               AM-PAC PT 6 Clicks Mobility  Outcome Measure Help needed turning from your back to your side while in a flat bed without using bedrails?: Total Help needed moving from lying on your back to sitting on the side of a flat bed without using bedrails?: Total Help needed moving to and from a bed to a chair (including a wheelchair)?: Total Help needed standing up from a chair using your arms (e.g., wheelchair or bedside chair)?: Total Help needed to walk in hospital room?: Total Help needed climbing 3-5 steps with a railing? : Total 6 Click Score: 6    End of Session Equipment Utilized During Treatment: Gait belt Activity Tolerance: Patient limited by lethargy Patient left: in bed;with call bell/phone within reach;with bed alarm set;with family/visitor present Nurse Communication: Mobility status;Need for lift equipment;Other (comment) (IV bleeding) PT Visit Diagnosis: Unsteadiness on feet (R26.81);Other abnormalities of gait and mobility (R26.89)    Time: 2956-2130 PT Time Calculation (min) (ACUTE ONLY): 35 min  Charges:   PT Evaluation $PT Eval Moderate Complexity: 1 Mod   PT General Charges $$ ACUTE PT VISIT: 1 Visit         Nyaisha Simao B, PT, DPT Acute Rehab Services [phone]   Ieasha Boerema 03/28/2024, 2:35 PM

## 2024-03-29 DIAGNOSIS — G9389 Other specified disorders of brain: Secondary | ICD-10-CM

## 2024-03-29 DIAGNOSIS — S129XXA Fracture of neck, unspecified, initial encounter: Secondary | ICD-10-CM | POA: Diagnosis not present

## 2024-03-29 DIAGNOSIS — G939 Disorder of brain, unspecified: Secondary | ICD-10-CM | POA: Diagnosis not present

## 2024-03-29 DIAGNOSIS — F03C0 Unspecified dementia, severe, without behavioral disturbance, psychotic disturbance, mood disturbance, and anxiety: Secondary | ICD-10-CM | POA: Diagnosis not present

## 2024-03-29 LAB — GLUCOSE, CAPILLARY
Glucose-Capillary: 97 mg/dL (ref 70–99)
Glucose-Capillary: 119 mg/dL — ABNORMAL HIGH (ref 70–99)
Glucose-Capillary: 105 mg/dL — ABNORMAL HIGH (ref 70–99)

## 2024-03-29 MED ORDER — ACETAMINOPHEN 160 MG/5ML PO SOLN
650.0000 mg | Freq: Three times a day (TID) | ORAL | Status: DC
  Administered 2024-03-30 – 2024-04-03 (×12): 650 mg via ORAL
  Filled 2024-03-29 (×13): qty 20.3

## 2024-03-29 MED ORDER — MORPHINE SULFATE (CONCENTRATE) 10 MG /0.5 ML PO SOLN
10.0000 mg | ORAL | Status: DC | PRN
  Administered 2024-03-31 – 2024-04-03 (×5): 10 mg via ORAL
  Filled 2024-03-29 (×5): qty 0.5

## 2024-03-29 MED ORDER — KETOROLAC TROMETHAMINE 30 MG/ML IJ SOLN
15.0000 mg | Freq: Three times a day (TID) | INTRAMUSCULAR | Status: AC
  Administered 2024-03-29 – 2024-03-30 (×3): 15 mg via INTRAVENOUS
  Filled 2024-03-29 (×3): qty 1

## 2024-03-29 NOTE — Progress Notes (Signed)
 Speech Language Pathology Treatment: Dysphagia  Patient Details Name: Steven Gibbs MRN: [id number] DOB: [birth year] Today's Date: 03/29/2024 Time: 3244-0102 SLP Time Calculation (min) (ACUTE ONLY): 21 min  Assessment / Plan / Recommendation Clinical Impression  Wife requested SLP return to reassess pt swallowing abilities due to significantly improved alertness. Pt upright in bed, with eyes open and conversing with wife, however utterances were quite tangential. Repeat oral mechanism examination was still fairly limited, however facial ROM and symmetry were [facility] and dentition was observed to be adequate. He did not accept ice chips, but held cup to self-feed (with assist) thin liquids by cup/straw. Overt coughing was immediate with initial large/impulsive sips. This presentation decreased and then eliminated with clinician max verbal and tactile cues (straw removal) for small sips. He orally accepted and cleared x1 small tsp of puree and then swiftly fell asleep. No overt s/sx to follow puree intake. While pt's overall alertness has improved, his overall endurance, awareness and fleeting attention continue to impact swallow function. Recommend continue NPO, but will allow small sips of water by cup/straw after oral care with full supervision (free water protocol). Ensure pt is alert and please control for small sips as pt is unable to moderate this at this time. Will continue f/u.    HPI HPI: Pt is a 78 y.o. male presenting to [facility] on 03/25/24 for weakness and falls, MRI determined brain lesion of the L temporal and frontal lobes expanding to the BG. Also found to have acute fractures at C5 and C6 spinous processes. At baseline patient is sometimes able to recognize his wife, sometimes will answer simple questions, over the past 6 weeks patient had decline and has had multiple falls. CT chest (03/26/24) revealed Trace bilateral pleural effusions. Posterior lower lobe atelectasis or pneumonia. Bronchial wall  thickening and occlusive debris throughout the left lower lobe bronchi favors aspiration. PMH of dementia, Parkinsons, HTN, hx of falls, cervical fx.      SLP Plan  Continue with current plan of care          Recommendations  Diet recommendations: NPO (free water protocol, small sips of water when alert and following oral care) Liquids provided via: Cup;Straw Medication Administration: Via alternative means Supervision: Full supervision/cueing for compensatory strategies;Staff to assist with self feeding Compensations: Minimize environmental distractions;Slow rate;Small sips/bites Postural Changes and/or Swallow Maneuvers: Seated upright 90 degrees                  Oral care QID;Oral care prior to ice chip/H20     Dysphagia, unspecified (R13.10)     Continue with current plan of care      Gordon Latus, MA, CCC-SLP Acute Rehabilitation Services Office Number: [phone]  Corliss Dies  03/29/2024, 9:50 AM

## 2024-03-29 NOTE — Plan of Care (Signed)
 Much more alert and talkative today.  Continues to be confused and speech is unclear but following simple commands with eyes open more.  Did take a few sips of water then closed his eyes and went back to sleep and I had to repeatedly ask him to swallow the water, so no more water given at this time.    Problem: Health Behavior/Discharge Planning: Goal: Ability to manage health-related needs will improve Outcome: Progressing   Problem: Activity: Goal: Risk for activity intolerance will decrease Outcome: Progressing   Problem: Coping: Goal: Level of anxiety will decrease Outcome: Progressing   Problem: Safety: Goal: Ability to remain free from injury will improve Outcome: Progressing   Problem: Skin Integrity: Goal: Risk for impaired skin integrity will decrease Outcome: Progressing

## 2024-03-29 NOTE — Evaluation (Signed)
 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) 03/25/2024   Difficulty walking 09/05/2012   IMO SNOMED Dx Update Oct 2024     Essential hypertension 03/25/2024   Fracture of cervical spinous process, initial encounter (HCC) 03/25/2024   Fracture of right ankle, lateral malleolus 09/05/2012   High cholesterol    Hypertension    Memory loss    Multiple falls 03/25/2024   Screening for malignant neoplasm of colon 03/25/2024   Past Surgical History:  Past Surgical History:  Procedure Laterality Date   BACK SURGERY     ORIF ANKLE FRACTURE  07/11/2012   Procedure: OPEN REDUCTION INTERNAL FIXATION (ORIF) ANKLE FRACTURE;  Surgeon: Amada Backer, MD;  Location: [facility];  Service: Orthopedics;  Laterality: Right;  Lateral malleous /  syndesmosis disruption    HPI:  Pt is a 78 y.o. male presenting to [facility] on 03/25/24 for weakness and falls, MRI determined brain lesion of the L temporal and frontal lobes expanding to the BG. Also found to have acute fractures at C5 and C6 spinous processes. At baseline patient is sometimes able to recognize his wife, sometimes will answer simple questions, over the past 6 weeks patient had decline and has had multiple falls. CT chest (03/26/24) revealed Trace bilateral pleural effusions. Posterior lower lobe atelectasis or pneumonia. Bronchial wall thickening and occlusive debris throughout the left lower lobe bronchi favors aspiration. PMH of dementia, Parkinsons, HTN, hx of falls, cervical fx.    Assessment / Plan / Recommendation  Clinical Impression  Wife and RN report that pt's alertness today is an  improvement from previous date and that he is able to be aroused more easily. Note, cervical brace (hard collar) present. Oral mechanism examination limited due to lethargy/poor ability to follow directions, however no obvious facial asymmetry apparent.  Pt still very lethargic, but with brief moments of eye opening and intermittent verbalizations. He actively accepted x2 ice chips with 1/2 manipulated and swallowed swiftly. The other was orally held and followed by delayed cough. Despite max verbal and tactile stimulation cues, pt would not arouse to complete oral manipulation of 2nd ice chip and suspect delayed swallow initation elicited nonvolitional cough response. Recommend continue NPO for now until pt more alert. Discussed with RN and wife who expressed understanding and agreement.  SLP Visit Diagnosis: Dysphagia, unspecified (R13.10)    Aspiration Risk       Diet Recommendation NPO    Medication Administration: Via alternative means    Other  Recommendations Oral Care Recommendations: Oral care QID     Assistance Recommended at Discharge    Functional Status Assessment Patient has had a recent decline in their functional status and demonstrates the ability to make significant improvements in function in a reasonable and predictable amount of time.  Frequency and Duration min 2x/week  2 weeks       Prognosis Prognosis for improved oropharyngeal function: Fair Barriers to Reach Goals: Cognitive deficits      Swallow Study   General Date of Onset: 03/25/24 HPI: Pt is a 78 y.o. male presenting to [facility] on 03/25/24 for weakness and falls, MRI determined brain lesion of the L temporal and  frontal lobes expanding to the BG. Also found to have acute fractures at C5 and C6 spinous processes. At baseline patient is sometimes able to recognize his wife, sometimes will answer simple questions, over the past 6 weeks patient had decline and has had multiple falls. CT chest (03/26/24) revealed  Trace bilateral pleural effusions. Posterior lower lobe atelectasis or pneumonia. Bronchial wall thickening and occlusive debris throughout the left lower lobe bronchi favors aspiration. PMH of dementia, Parkinsons, HTN, hx of falls, cervical fx. Type of Study: Bedside Swallow Evaluation Previous Swallow Assessment: none per EMR Diet Prior to this Study: NPO Temperature Spikes Noted: No Respiratory Status: Room air History of Recent Intubation: No Behavior/Cognition: Lethargic/Drowsy Oral Cavity Assessment:  (difficult to assess) Oral Care Completed by SLP: No Oral Cavity - Dentition:  (difficult to assess) Vision:  (difficult to assess) Self-Feeding Abilities: Total assist Patient Positioning: Upright in bed Baseline Vocal Quality: Normal Volitional Cough: Cognitively unable to elicit Volitional Swallow: Unable to elicit    Oral/Motor/Sensory Function Overall Oral Motor/Sensory Function:  (difficult to assess, no obvious symmetry)   Ice Chips Ice chips: Impaired Presentation: Spoon Oral Phase Impairments: Poor awareness of bolus;Impaired mastication;Reduced lingual movement/coordination Oral Phase Functional Implications: Oral holding Pharyngeal Phase Impairments: Suspected delayed Swallow;Cough - Delayed   Thin Liquid Thin Liquid: Not tested    Nectar Thick Nectar Thick Liquid: Not tested   Honey Thick Honey Thick Liquid: Not tested   Puree Puree: Not tested   Solid     Solid: Not tested      Steven Latus, MA, CCC-SLP Acute Rehabilitation Services Office Number: [phone]  Corliss Dies 03/29/2024,9:46 AM

## 2024-03-29 NOTE — Consult Note (Signed)
 Consultation Note Date: 03/29/2024   Patient Name: Steven Gibbs  DOB: November 08, 1945  MRN: [id number]  Age / Sex: 78 y.o., male  PCP: Artemisa Bile, MD Referring Physician: Danette Duos, MD  Reason for Consultation:  goals of care  HPI/Patient Profile: 78 y.o. male  with past medical history of Parkinson's, dementia, multiple falls, HTN, HLD, admitted on 03/25/2024 with fall. Workup reveals acute fractures at C5, C6, fractured ribs, new brain mass, and aspiration pnuemonia. There are not good options for biopsy of brian lesion, and likely not good options for treatment given patient's advanced dementia. Palliative medicine consulted for GOC.    Primary Decision Maker NEXT OF KIN  - spouse- Kyrollos Cordell  Discussion: I have reviewed medical records including Care Everywhere, progress notes from this and prior admissions, labs and imaging, discussed with RN and MD.   Imaging reviewed- MRI brain- noted mass in L basal ganglia, with additional enhancement in L temporal and L frontal lobe, L hypothalamus. 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 Nuerosurgery consulted- is not resectable, no good option for biopsy. Reviewed neuro note- likely malignancy vs inflammatory.   MRI spine- noteable fractures and spinal edema- concern for ligament injury.   CT CAP- no significant finding.   Spinal fluid from CSF - not suspicious for infection.    SLP note reviewed- mental status affecting ability to take po- recommending npo but allow sips of water when awake and alert.   Attending note- pneumonia being treated with Unasyn , patient is DNR/DNI.   On evaluation patient is somnolent. Mumbling unintelligibly. Spouse reports he is much more awake today than he was yesterday. He doesn't open his eyes or follow my commands.   Met with spouse in conference room.   I introduced Palliative Medicine as specialized medical care for  people living with serious illness. It focuses on providing relief from the symptoms and stress of a serious illness. The goal is to improve quality of life for both the patient and the family.  We discussed a brief life review of the patient. He is named after a [place] man that fought with his father in [place]. He goes by [employer]. They also have a son named Larinda Plover who has high functioning autism, but requires a lot of assistance. Larinda Plover went to [employer], then transferred to [employer]. 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 Rexene Catching and Larinda Plover met in high school. Larinda Plover used to enjoy hunting- he would travel with his good friend Dr. Artemisa Bile (who is also his primary care MD) out of state to go on hunting trips.   As far as functional and nutritional status - prior to admission- he was unable to dress himself or toilet on his own. He could feed himself. Able to ambulate minimally- bed to chair. Used a wheelchair around the house. Has had several falls. Slept a lot during the day. Had gotten to the point where he couldn't really leave the house.    We discussed patient's current illness and what it means  in the larger context of patient's on-going co-morbidities.  He has aspiration pneumonia and finding of a large brain lesion- these are both likely contributing to his poor mental status. Rexene Catching feels that his mental status improved some with the initiation of antibiotics. We discussed that any insults are likely to worsen his dementia.  We also discussed the concept of the cancer cliff. That often a malignancy can be present and not cause problems- until it does and the problems can lead to end of life.   Natural disease trajectory and expectations at EOL were discussed. Rexene Catching understands that patient's time of life is likely limited at this point. She would not want him to undergo invasive procedures.   We discussed potential for rehab- Rexene Catching agrees that there is not likely to be much benefit from  rehab.   Advanced Care Planning- 30 mins With Anne's permission advanced care planning was discussed. Patient has DNR code status in place.  He would not want artificial feeding tube- and it wouldn't be recommended based on his advanced dementia status.  Further aggressive medical measures, rehospitalization vs more comfort focused path with hospice care was reviewed.  Rexene Catching believes patient would choose comfort focused path.  If he has a recurrence of pneumonia or worsening of his status- she would not have him return to hospital- she believes he would prefer to go through dying process with good symptom management. She notes that his journey with dementia has been  hell for both of them.  Hospice services and philosophy of care reviewed.  She would like to attempt to have patient receive care at [facility] nursing facility in long term care placement with hospice. Larinda Plover has a long term care insurance that is active and should pay. He also has a private caregiver that assists intermittently. If he is unable to obtain placement then she would want to take him home with hospice and private care.   We discussed his prognosis per Anne's request. If his mental status continues to improve and he is able to take in adequate po intake- then I shared his prognosis is likely less than 6 months. However, I cautioned that if his mental status did not improve, and he continued to not be able to eat and drink- then he would decline much quicker and his prognosis would likely be less than two weeks. Rexene Catching was understandably emotional, but was not surprised by this prognosis.    SUMMARY OF RECOMMENDATIONS -Newly diagnosed brain lesion in the setting of advanced dementia and Parkinson's- no further diagnostic workup- focus on comfort measures -Aspiration pneumonia- complete antibiotic treatment- however, only treat symptomatically if recurrence -Poor po intake, risk for aspiration- continue npo except sips with meds  for 24 hours- if improvement then start comfort feeding with aspiration precautions- no artificial feeding -TOC consult placed for referral to hospice and attempt to get LTC bed at [facility] -Spinal fractures, spinal edema, rib fractures- start acetaminophen  650mg  po TID, toradol 15mg  IV q8 hours x 3 doses, start morphine  concentrated solution 10mg  po q2hrs prn for severe pain, anxiety or shortness of breath     Code Status/Advance Care Planning:   Code Status: Limited: Do not attempt resuscitation (DNR) -DNR-LIMITED -Do Not Intubate/DNI     Prognosis:   < 6 months - less than 2 weeks if patient's mental status and po intake fail to improve  Discharge Planning: Skilled Nursing Facility with Hospice  Primary Diagnoses: Present on Admission:  Brain lesion  Dementia in Parkinson's  disease (HCC)  Fracture of cervical spinous process, initial encounter [facility])  Essential hypertension   Review of Systems  Unable to perform ROS: Mental status change    Physical Exam Vitals and nursing note reviewed.  Constitutional:      General: He is not in acute distress.    Appearance: He is ill-appearing.   Cardiovascular:     Rate and Rhythm: Normal rate.  Pulmonary:     Effort: Pulmonary effort is normal.   Skin:    General: Skin is warm and dry.   Neurological:     Comments: UTA orientation, mumbling unintelligibly, eyes close, does not follow commands    Vital Signs: BP 130/72 (BP Location: Left Arm)   Pulse 62   Temp 98.8 F (37.1 C) (Axillary)   Resp 18   Ht 5' 8 (1.727 m)   Wt 69 kg   SpO2 97%   BMI 23.13 kg/m  Pain Scale: PAINAD   Pain Score: 0-No pain   SpO2: SpO2: 97 % O2 Device:SpO2: 97 % O2 Flow Rate: .   IO: Intake/output summary:  Intake/Output Summary (Last 24 hours) at 03/29/2024 1145 Last data filed at 03/28/2024 1546 Gross per 24 hour  Intake --  Output 200 ml  Net -200 ml    LBM: Last BM Date : 03/28/24 Baseline Weight: Weight: 69 kg Most  recent weight: Weight: 69 kg       Thank you for this consult. Palliative medicine will continue to follow and assist as needed.   Signed by: Micki Alas, AGNP-C Palliative Medicine  Time includes:   Preparing to see the patient (e.g., review of tests) Obtaining and/or reviewing separately obtained history Performing a medically necessary appropriate examination and/or evaluation Counseling and educating the patient/family/caregiver Ordering medications, tests, or procedures Referring and communicating with other health care professionals (when not reported separately) Documenting clinical information in the electronic or other health record Independently interpreting results (not reported separately) and communicating results to the patient/family/caregiver Care coordination (not reported separately) Clinical documentation   Please contact Palliative Medicine Team phone at [phone] for questions and concerns.  For individual provider: See Tilford Foley

## 2024-03-29 NOTE — Progress Notes (Signed)
 PROGRESS NOTE    Steven Gibbs  QIO:[id number] DOB: [birth year] DOA: 03/25/2024 PCP: Steven Bile, MD   Brief Narrative: 78 year old with past medical history significant for Parkinson dementia, hypertension present weakness and fall.  Patient fell on the floor able to get him on the slip on the floor.  Next day he was complaining of pain.  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  Lumbar puncture performed by EDP on 6/10 with no concerning findings.  Transferred to Steven Gibbs for further evaluation  At baseline patient is sometimes able to recognize his wife, sometimes will answer simple questions, over the past 6 weeks patient had decline and has had multiple falls.  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 He has  baseline dementia, Parkinson's disease.  - MRI brain: Nodular enhancement left basal ganglia, left external capsule, area of gyral enhancement anterior left temporal and inferior frontal lobe, left hypothalamus.  -Neurosurgery evaluated patient, recommend neurology evaluation as well as workup.  These lesions are not amenable to  surgical resection. - 6/10 Underwent  LP: CSF: White blood cell 2: Lymphocyte 85% meningitis encephalitis panel negative, obtain 76. Lymphocyte flow cytology pending.  -EEG: Continuous slow generalized.  -Neurology consulted. Can stop Acyclovir .  -CT Chest , abdomen-pelvis: trace bilateral pleural effusion, posterior lower lobe atelectasis or PNA, Bronchia wall thickening.  Enlarged prostate indenting the floor of the bladder. Mild bladder wall thickening may be due to chronic outlet obstruction. Aortic Atherosclerosis   -There was not enough CSF fluid for cytology, VDRL and fungal culture not sent. Discussed with Steven Gibbs, no need to repeat LP; cytology is usually low yield, Syphilis  and fungal  less likely. He think patient is more palliative care, this could be inflammatory or Malignancy.  -Palliative care consulted. Plan to transition to long term care with hospice follow up.   Aspiration PNA;  CT chest: Posterior lower lobe atelectasis or pneumonia. Bronchial wall thickening and occlusive debris throughout the left lower lobe bronchi favors aspiration. -Continue with  Unasyn     Fracture of the cervical spinous process Neurosurgery recommend C collar.   Essential hypertension Continue Cardizem  and metoprolol .   Dementia, Parkinson's disease Continue with Aricept , Lexapro , Remeron .   Mildly displaced fractures of the right eighth through twelfth ribs. No evidence of underlying pathologic lesion. -Incentive spirometry  Estimated body mass index is 23.13 kg/m as calculated from the following:   Height as of this encounter: 5' 8 (1.727 m).   Weight as of this encounter: 69 kg.   DVT prophylaxis: SCD Code Status:  DNR, DNI Family Communication: care discussed with wife who was at bedside.  Disposition Plan:  Status is: Inpatient Remains inpatient appropriate because: management of brain lesion, encephalopathy     Consultants:  Neurology Neurosurgery   Procedures:  LP 6/10  Antimicrobials:    Subjective: He is sleepy, open eyes momentarily. He said few words today.  Wife at bedside. She wouldn't want him to get Biopsy. She would want a more comfort care approach. She will discussed further with Palliative   Objective: Vitals:   03/29/24 0019 03/29/24 0419 03/29/24 0813 03/29/24 1204  BP: (!) 148/76 130/66 130/72 Steven Gibbs)  142/72  Pulse: 66 (!) 58 62 62  Resp: 18 16 18 18   Temp: 97.9 F (36.6 C) 98 F (36.7 C) 98.8 F (37.1 C) 97.7 F (36.5 C)  TempSrc: Oral Oral Axillary  Oral  SpO2:  97% 97% 95%  Weight:      Height:        Intake/Output Summary (Last 24 hours) at 03/29/2024 1459 Last data filed at 03/29/2024 1257 Gross per 24 hour  Intake 720 ml  Output 200 ml  Net 520 ml   Filed Weights   03/25/24 1514  Weight: 69 kg    Examination:  General exam: Sleepy, cervical collar  in placed.  Respiratory system: CTA Cardiovascular system: S 1 S 2 RRR Gastrointestinal system: BS present, soft, nt Central nervous system: Sleepy Extremities: No edema S  Data Reviewed: I have personally reviewed following labs and imaging studies  CBC: Recent Labs  Lab 03/25/24 1539 03/26/24 0455 03/27/24 0553 03/28/24 1114  WBC 8.4 10.1 9.7 8.5  NEUTROABS 6.6  --  7.8*  --   HGB 13.7 13.1 14.0 12.7*  HCT 41.0 40.2 41.5 37.8*  MCV 89.7 88.5 87.7 86.9  PLT 353 293 336 322   Basic Metabolic Panel: Recent Labs  Lab 03/25/24 1539 03/26/24 0455 03/27/24 0553 03/28/24 1114  NA 137 137 135 135  K 3.5 3.8 3.7 4.1  CL 100 100 98 99  CO2 28 27 29 26   GLUCOSE 96 96 96 99  BUN 12 10 9  7*  CREATININE 0.63 0.66 0.74 0.73  CALCIUM 8.8* 8.7* 8.8* 8.4*  MG  --   --  2.0  --    GFR: Estimated Creatinine Clearance: 77.2 mL/min (by C-G formula based on SCr of 0.73 mg/dL). Liver Function Tests: Recent Labs  Lab 03/25/24 1539 03/27/24 0553  AST 35 30  ALT 16 16  ALKPHOS 94 93  BILITOT 0.9 1.0  PROT 6.0* 5.9*  ALBUMIN 3.1* 2.8*   No results for input(s): LIPASE, AMYLASE in the last 168 hours. No results for input(s): AMMONIA in the last 168 hours. Coagulation Profile: No results for input(s): INR, PROTIME in the last 168 hours. Cardiac Enzymes: Recent Labs  Lab 03/25/24 1539  CKTOTAL 1,556*   BNP (last 3 results) No results for input(s): PROBNP in the last 8760 hours. HbA1C: No results for input(s): HGBA1C in the last 72 hours. CBG: Recent Labs  Lab 03/27/24 2351 03/28/24 0718 03/28/24 1146 03/29/24 0628 03/29/24 1205  GLUCAP  109* 113* 100* 97 119*   Lipid Profile: No results for input(s): CHOL, HDL, LDLCALC, TRIG, CHOLHDL, LDLDIRECT in the last 72 hours. Thyroid  Function Tests: No results for input(s): TSH, T4TOTAL, FREET4, T3FREE, THYROIDAB in the last 72 hours. Anemia Panel: No results for input(s): VITAMINB12, FOLATE, FERRITIN, TIBC, IRON, RETICCTPCT in the last 72 hours. Sepsis Labs: No results for input(s): PROCALCITON, LATICACIDVEN in the last 168 hours.  Recent Results (from the past 240 hours)  CSF culture w Gram Stain     Status: None (Preliminary result)   Collection Time: 03/25/24 11:35 PM   Specimen: CSF; Cerebrospinal Fluid  Result Value Ref Range Status   Specimen Description   Final    CSF Performed at [facility], [address], [place], [place] [address]    Special Requests   Final    NONE Performed at [facility], [address], [place], [place] [address]    Gram Stain   Final    CYTOSPIN SMEAR WBC PRESENT, PREDOMINANTLY  MONONUCLEAR NO ORGANISMS SEEN    Culture   Final    NO GROWTH 3 DAYS Performed at [facility], [address] [place], [place] [address]    Report Status PENDING  Incomplete         Radiology Studies: No results found.       Scheduled Meds:  acetaminophen  (TYLENOL ) oral liquid 160 mg/5 mL  650 mg Oral TID   diltiazem   180 mg Oral Daily   donepezil   10 mg Oral Daily   escitalopram   5 mg Oral Daily   ketorolac  15 mg Intravenous Q8H   metoprolol  succinate  50 mg Oral Daily   mirtazapine   15 mg Oral QHS   potassium chloride   40 mEq Oral Once   Continuous Infusions:  ampicillin -sulbactam (UNASYN ) IV 3 g (03/29/24 1138)     LOS: 4 days    Time spent: 35 minutes    Kwan Shellhammer A Ahmaud Duthie, MD [facility]   If 7PM-7AM, please contact night-coverage [email or web address]  03/29/2024, 2:59 PM

## 2024-03-30 DIAGNOSIS — G939 Disorder of brain, unspecified: Secondary | ICD-10-CM | POA: Diagnosis not present

## 2024-03-30 LAB — BASIC METABOLIC PANEL WITH GFR
Anion gap: 12 (ref 5–15)
BUN: 6 mg/dL — ABNORMAL LOW (ref 8–23)
CO2: 27 mmol/L (ref 22–32)
Calcium: 8.8 mg/dL — ABNORMAL LOW (ref 8.9–10.3)
Chloride: 99 mmol/L (ref 98–111)
Creatinine, Ser: 0.63 mg/dL (ref 0.61–1.24)
GFR, Estimated: >60 mL/min (ref >60)
Glucose, Bld: 94 mg/dL (ref 70–99)
Potassium: 3.6 mmol/L (ref 3.5–5.1)
Sodium: 138 mmol/L (ref 135–145)

## 2024-03-30 LAB — GLUCOSE, CAPILLARY
Glucose-Capillary: 103 mg/dL — ABNORMAL HIGH (ref 70–99)
Glucose-Capillary: 107 mg/dL — ABNORMAL HIGH (ref 70–99)
Glucose-Capillary: 113 mg/dL — ABNORMAL HIGH (ref 70–99)
Glucose-Capillary: 139 mg/dL — ABNORMAL HIGH (ref 70–99)
Glucose-Capillary: 96 mg/dL (ref 70–99)

## 2024-03-30 LAB — CSF CULTURE W GRAM STAIN: Culture: NO GROWTH

## 2024-03-30 MED ORDER — DEXTROSE IN LACTATED RINGERS 5 % IV SOLN: INTRAVENOUS | Status: AC

## 2024-03-30 NOTE — Progress Notes (Signed)
 Speech Language Pathology Treatment: Dysphagia  Patient Details Name: Steven Gibbs MRN: [id number] DOB: 1946/03/02 Today's Date: 03/30/2024 Time: 8295-6213 SLP Time Calculation (min) (ACUTE ONLY): 15 min  Assessment / Plan / Recommendation Clinical Impression  Pt was seen for dysphagia treatment. He was alert and pleasantly confused during the session; responses were frequently unrelated to SLP's questions/statements. He tolerated puree, regular texture solids, and thin liquids via cup without overt s/s of aspiration, but coughing was noted with thin liquids via straw. Pt accepted a single bolus that required mastication and demonstrated prolonged mastication, but with subsequent boluses he either pursed his lips or tapped the bolus between his lips. A puree diet with thin liquids via cup is recommended at this time for comfort. However, considering goals of care, more advanced solids may be provided if pt/family desires. Attempts were made to contact the pt's wife to provide an update, but they were unsuccessful. SLP will continue to follow pt while it continues to align with GOC.     HPI HPI: Pt is a 78 y.o. male presenting to [facility] on 03/25/24 for weakness and falls, MRI determined brain lesion of the L temporal and frontal lobes expanding to the BG. Also found to have acute fractures at C5 and C6 spinous processes. At baseline patient is sometimes able to recognize his wife, sometimes will answer simple questions, over the past 6 weeks patient had decline and has had multiple falls. CT chest (03/26/24) revealed Trace bilateral pleural effusions. Posterior lower lobe atelectasis or pneumonia. Bronchial wall thickening and occlusive debris throughout the left lower lobe bronchi favors aspiration. PMH of dementia, Parkinsons, HTN, hx of falls, cervical fx.      SLP Plan  Continue with current plan of care          Recommendations  Diet recommendations: Dysphagia 1 (puree);Thin liquid Liquids  provided via: Cup Medication Administration: Crushed with puree Supervision: Full supervision/cueing for compensatory strategies;Staff to assist with self feeding Compensations: Minimize environmental distractions;Slow rate;Small sips/bites Postural Changes and/or Swallow Maneuvers: Seated upright 90 degrees                  Oral care QID;Oral care prior to ice chip/H20   Frequent or constant Supervision/Assistance Dysphagia, unspecified (R13.10)     Continue with current plan of care    Steven Gibbs I. Valda Garnet, MS, CCC-SLP Acute Rehabilitation Services Office number [phone]  Steven Gibbs  03/30/2024, 3:18 PM

## 2024-03-30 NOTE — Progress Notes (Signed)
 PROGRESS NOTE    Steven Gibbs  JWJ:[id number] DOB: 05/19/1946 DOA: 03/25/2024 PCP: Artemisa Bile, MD   Brief Narrative: 78 year old with past medical history significant for Parkinson dementia, hypertension present weakness and fall.  Patient fell on the floor able to get him on the slip on the floor.  Next day he was complaining of pain.  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  Lumbar puncture performed by EDP on 6/10 with no concerning findings.  Transferred to [facility] for further evaluation  At baseline patient is sometimes able to recognize his wife, sometimes will answer simple questions, over the past 6 weeks patient had decline and has had multiple falls.  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 He has  baseline dementia, Parkinson's disease.  - MRI brain: Nodular enhancement left basal ganglia, left external capsule, area of gyral enhancement anterior left temporal and inferior frontal lobe, left hypothalamus.  -Neurosurgery evaluated patient, recommend neurology evaluation as well as workup.  These lesions are not amenable to  surgical resection. - 6/10 Underwent  LP: CSF: White blood cell 2: Lymphocyte 85% meningitis encephalitis panel negative, obtain 76. Lymphocyte flow cytology pending.  -EEG: Continuous slow generalized.  -Neurology consulted. Can stop Acyclovir .  -CT Chest , abdomen-pelvis: trace bilateral pleural effusion, posterior lower lobe atelectasis or PNA, Bronchia wall thickening.  Enlarged prostate indenting the floor of the bladder. Mild bladder wall thickening may be due to chronic outlet obstruction. Aortic Atherosclerosis   -There was not enough CSF fluid for cytology, VDRL and fungal culture not sent. Discussed with Dr Renaee Caro, no need to repeat LP; cytology is usually low yield, Syphilis  and fungal  less likely. He think patient is more palliative care, this could be inflammatory or Malignancy.  -Palliative care consulted. Plan to transition to long term care with hospice care.  He is more alert, remain confuse.   Aspiration PNA;  CT chest: Posterior lower lobe atelectasis or pneumonia. Bronchial wall thickening and occlusive debris throughout the left lower lobe bronchi favors aspiration. -Continue with  Unasyn   day 4   Fracture of the cervical spinous process Neurosurgery recommend C collar.   Essential hypertension Continue Cardizem  and metoprolol .   Dementia, Parkinson's disease Continue with Aricept , Lexapro , Remeron .   Mildly displaced fractures of the right eighth through twelfth ribs. No evidence of underlying pathologic lesion. -Incentive spirometry  Estimated body mass index is 23.13 kg/m as calculated from the following:   Height as of this encounter: 5' 8 (1.727 m).   Weight as of this encounter: 69 kg.   DVT prophylaxis: SCD Code Status:  DNR, DNI Family Communication: care discussed with wife who was at bedside 6/14  Disposition Plan:  Status is: Inpatient Remains inpatient appropriate because: management of brain lesion, encephalopathy     Consultants:  Neurology Neurosurgery   Procedures:  LP 6/10  Antimicrobials:    Subjective: He is awake and alert, confuse.   Objective: Vitals:   03/29/24 1942 03/30/24 0008 03/30/24 0334 03/30/24 0815  BP: (!) 151/82 139/72 (!) 165/73 (!) 145/72  Pulse: (!) 51 (!) 51 (!) 51 (!) 52  Resp: 18 17 18    Temp: Steven Aas)  97.5 F (36.4 C) 97.9 F (36.6 C) 98 F (36.7 C) (!) 97.5 F (36.4 C)  TempSrc: Oral   Axillary  SpO2: 98% 97% 100% 100%  Weight:      Height:        Intake/Output Summary (Last 24 hours) at 03/30/2024  1150 Last data filed at 03/29/2024 1257 Gross per 24 hour  Intake 480 ml  Output --  Net 480 ml   Filed Weights   03/25/24 1514  Weight: 69 kg    Examination:  General exam: Awake, alert cervical collar  in placed.  Respiratory system: CTA Cardiovascular system: S 1, S 2 RRR Gastrointestinal system: BS present, soft, nt Central nervous system: Awake, alert Extremities: No edema S  Data Reviewed: I have personally reviewed following labs and imaging studies  CBC: Recent Labs  Lab 03/25/24 1539 03/26/24 0455 03/27/24 0553 03/28/24 1114  WBC 8.4 10.1 9.7 8.5  NEUTROABS 6.6  --  7.8*  --   HGB 13.7 13.1 14.0 12.7*  HCT 41.0 40.2 41.5 37.8*  MCV 89.7 88.5 87.7 86.9  PLT 353 293 336 322   Basic Metabolic Panel: Recent Labs  Lab 03/25/24 1539 03/26/24 0455 03/27/24 0553 03/28/24 1114 03/30/24 0649  NA 137 137 135 135 138  K 3.5 3.8 3.7 4.1 3.6  CL 100 100 98 99 99  CO2 28 27 29 26 27   GLUCOSE 96 96 96 99 94  BUN 12 10 9  7* 6*  CREATININE 0.63 0.66 0.74 0.73 0.63  CALCIUM 8.8* 8.7* 8.8* 8.4* 8.8*  MG  --   --  2.0  --   --    GFR: Estimated Creatinine Clearance: 77.2 mL/min (by C-G formula based on SCr of 0.63 mg/dL). Liver Function Tests: Recent Labs  Lab 03/25/24 1539 03/27/24 0553  AST 35 30  ALT 16 16  ALKPHOS 94 93  BILITOT 0.9 1.0  PROT 6.0* 5.9*  ALBUMIN 3.1* 2.8*   No results for input(s): LIPASE, AMYLASE in the last 168 hours. No results for input(s): AMMONIA in the last 168 hours. Coagulation Profile: No results for input(s): INR, PROTIME in the last 168 hours. Cardiac Enzymes: Recent Labs  Lab 03/25/24 1539  CKTOTAL 1,556*   BNP (last 3 results) No results for input(s): PROBNP in the last 8760 hours. HbA1C: No results for input(s): HGBA1C in the last 72 hours. CBG: Recent Labs  Lab 03/29/24 0628 03/29/24 1205 03/29/24 1745 03/30/24 0007 03/30/24 0639  GLUCAP 97 119* 105* 103* 96   Lipid Profile: No results  for input(s): CHOL, HDL, LDLCALC, TRIG, CHOLHDL, LDLDIRECT in the last 72 hours. Thyroid  Function Tests: No results for input(s): TSH, T4TOTAL, FREET4, T3FREE, THYROIDAB in the last 72 hours. Anemia Panel: No results for input(s): VITAMINB12, FOLATE, FERRITIN, TIBC, IRON, RETICCTPCT in the last 72 hours. Sepsis Labs: No results for input(s): PROCALCITON, LATICACIDVEN in the last 168 hours.  Recent Results (from the past 240 hours)  CSF culture w Gram Stain     Status: None   Collection Time: 03/25/24 11:35 PM   Specimen: CSF; Cerebrospinal Fluid  Result Value Ref Range Status   Specimen Description   Final    CSF Performed at [facility], [address] [place], [place] [address]    Special Requests   Final    NONE Performed at [facility], [address], [place], [place] [address]    Gram Stain   Final    CYTOSPIN SMEAR WBC PRESENT, PREDOMINANTLY MONONUCLEAR NO ORGANISMS  SEEN    Culture   Final    NO GROWTH 3 DAYS Performed at [facility], [address], [place], [place] [address]    Report Status 03/30/2024 FINAL  Final         Radiology Studies: No results found.       Scheduled Meds:  acetaminophen  (TYLENOL ) oral liquid 160 mg/5 mL  650 mg Oral TID   diltiazem   180 mg Oral Daily   donepezil   10 mg Oral Daily   escitalopram   5 mg Oral Daily   metoprolol  succinate  50 mg Oral Daily   mirtazapine   15 mg Oral QHS   Continuous Infusions:  ampicillin -sulbactam (UNASYN ) IV 3 g (03/30/24 1116)   dextrose  5% lactated ringers        LOS: 5 days    Time spent: 35 minutes    Espiridion Supinski A Barb Shear, MD [facility]   If 7PM-7AM, please contact night-coverage [email or web address]  03/30/2024, 11:50 AM

## 2024-03-30 NOTE — TOC Initial Note (Incomplete)
 Transition of Care [facility]) - Initial/Assessment Note    Patient Details  Name: Steven Gibbs MRN: [id number] Date of Birth: Sep 04, 1946  Transition of Care [facility]) CM/SW Contact:    Hilda Lovings, LCSW Phone Number: 03/30/2024, 10:20 AM  Clinical Narrative:                   Expected Discharge Plan:  (TBD) Barriers to Discharge: Continued Medical Work up   Patient Goals and CMS Choice CSW followed-up disposition recommendations (SNF placement).  CSW completed initial TOC work-up/assessment as noted by the following below.   CSW spoke with the patient to review SNF referral process per clinical recommendations and assessed the pt's SNF preference.   The patient expressed   DID The CSW contacted the patient's natural support ( ) and reviewed the clinical recommendations and assess SNF preference. The natural support expressed   DIDN'T The CSW attempted to contact the patient's natural support ( ) to update them to the patient's disposition. CSW was unable to make contact with the natural support. CSW   CSW updated the bedside nurse and additional clinical members ( ) to the information above regarding SNF initial efforts for SNF placement.    CSW referral efforts to support the patient's disposition FL2:  PASRR:  SNF referrals:   Note:   TOC Disposition follow-up needs  Please provide the patient or natural support with bed-offer updates.  Please continue with SNF placement efforts. Please update the clinical team to SNF placement efforts:  No other needs identified by this Clinical research associate currently. Patient needs and current disposition to be followed by    Expected Discharge Plan and Services  SNF  Living arrangements for the past 2 months: Single Family Home                  Prior Living Arrangements/Services Living arrangements for the past 2 months: Single Family Home Lives with:: Spouse Patient language and need for interpreter reviewed:: Yes Do you feel safe going  back to the place where you live?: Yes        Care giver support system in place?: Yes (comment) Current home services: DME (walker/ wheelchair x 2/ shower seat) Criminal Activity/Legal Involvement Pertinent to Current Situation/Hospitalization: No - Comment as needed  Activities of Daily Living  Permission Sought/Granted  Emotional Assessment Appearance:: Appears stated age   Affect (typically observed): Quiet     Psych Involvement: No (comment)  Admission diagnosis:  Brain mass [G93.89] Brain lesion [G93.9] Fall, initial encounter [W19.XXXA] Traumatic rhabdomyolysis, initial encounter (HCC) [T79.6XXA] Closed nondisplaced fracture of fifth cervical vertebra, unspecified fracture morphology, initial encounter [facility]) [S12.401A] 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 [place] [address] [place] [address] Phone: [phone] Fax: [phone]  [facility] - [place], [place] - [address] [place] [address] Phone: [phone] Fax: [phone]  Social Drivers of Health (SDOH) Social History: SDOH Screenings   Transportation Needs: Patient Unable To Answer (03/27/2024)  Utilities: Patient Unable To Answer (03/27/2024)  Tobacco Use: Medium Risk (03/25/2024)   SDOH Interventions:     Readmission Risk Interventions     No data to display

## 2024-03-31 DIAGNOSIS — G939 Disorder of brain, unspecified: Secondary | ICD-10-CM | POA: Diagnosis not present

## 2024-03-31 DIAGNOSIS — Z7189 Other specified counseling: Secondary | ICD-10-CM | POA: Diagnosis not present

## 2024-03-31 DIAGNOSIS — S129XXA Fracture of neck, unspecified, initial encounter: Secondary | ICD-10-CM | POA: Diagnosis not present

## 2024-03-31 DIAGNOSIS — R296 Repeated falls: Secondary | ICD-10-CM | POA: Diagnosis not present

## 2024-03-31 LAB — GLUCOSE, CAPILLARY
Glucose-Capillary: 111 mg/dL — ABNORMAL HIGH (ref 70–99)
Glucose-Capillary: 124 mg/dL — ABNORMAL HIGH (ref 70–99)
Glucose-Capillary: 122 mg/dL — ABNORMAL HIGH (ref 70–99)

## 2024-03-31 MED ORDER — METOPROLOL SUCCINATE ER 25 MG PO TB24
25.0000 mg | ORAL_TABLET | Freq: Every day | ORAL | Status: DC
  Administered 2024-04-02: 25 mg via ORAL
  Filled 2024-03-31 (×2): qty 1

## 2024-03-31 NOTE — NC FL2 (Signed)
 [place]  MEDICAID FL2 LEVEL OF CARE FORM     IDENTIFICATION  Patient Name: Steven Gibbs Birthdate: [birth year] Sex: male Admission Date (Current Location): 03/25/2024  [facility] [place] Number:  Producer, television/film/video and Address:  The . [facility], 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) 03/25/2024   Fracture of cervical spinous process, initial encounter (HCC) 03/25/2024   Essential hypertension 03/25/2024   Mixed hyperlipidemia 03/25/2024   Screening for malignant neoplasm of colon 03/25/2024   Left ankle sprain 09/05/2012   Fracture of right ankle, lateral malleolus 09/05/2012   Pain in joint, ankle and foot 09/05/2012   Difficulty walking 09/05/2012    Orientation RESPIRATION BLADDER Height & Weight      (disoriented)  Normal Incontinent Weight: 152 lb 1.9 oz (69 kg) Height:  5' 8 (172.7 cm)  BEHAVIORAL SYMPTOMS/MOOD NEUROLOGICAL BOWEL NUTRITION STATUS      Incontinent Diet (see DC summary)  AMBULATORY STATUS COMMUNICATION OF NEEDS Skin   Extensive Assist Verbally Normal                       Personal Care Assistance Level of Assistance  Bathing, Feeding, Dressing Bathing Assistance: Maximum assistance Feeding assistance: Maximum assistance Dressing Assistance: Maximum assistance     Functional Limitations Info  Speech Sight Info: Impaired Hearing Info: Impaired Speech Info: Impaired (dysarthria;  delayed responses)    SPECIAL CARE FACTORS FREQUENCY                   Contractures Contractures Info: Not present    Additional Factors Info  Code Status, Allergies, Psychotropic Code Status Info: DNR Allergies Info: NKA Psychotropic Info: Aricept  10mg  daily; Lexapro  5mg  daily; Remeron  15mg  daily at bed         Current Medications (03/31/2024):  This is the current hospital active medication list Current Facility-Administered Medications  Medication Dose Route Frequency Provider Last Rate Last Admin   acetaminophen  (TYLENOL ) 160 MG/5ML solution 650 mg  650 mg Oral TID Mahan, Kasie J, NP   650 mg at 03/31/24 4132   acetaminophen  (TYLENOL ) tablet 650 mg  650 mg Oral Q6H PRN Emokpae, Ejiroghene E, MD       Or   acetaminophen  (TYLENOL ) suppository 650 mg  650 mg Rectal Q6H PRN Emokpae, Ejiroghene E, MD       Ampicillin -Sulbactam (UNASYN ) 3 g in sodium chloride  0.9 % 100 mL IVPB  3 g Intravenous Q6H Williamson, Erin R, RPH 200 mL/hr at 03/31/24 1226 3 g at 03/31/24 1226   donepezil  (ARICEPT ) tablet 10 mg  10 mg Oral Daily Emokpae, Ejiroghene E, MD   10 mg at 03/31/24 0837   escitalopram  (LEXAPRO ) tablet 5 mg  5 mg Oral Daily Emokpae, Ejiroghene E, MD   5 mg at 03/31/24 0837   [START ON 04/01/2024] metoprolol  succinate (TOPROL -XL) 24 hr tablet 25 mg  25 mg Oral Daily  Regalado, Belkys A, MD       mirtazapine  (REMERON ) tablet 15 mg  15 mg Oral QHS Emokpae, Ejiroghene E, MD   15 mg at 03/30/24 2158   morphine  CONCENTRATE 10 mg / 0.5 ml oral solution 10 mg  10 mg Oral Q2H PRN Mahan, Kasie J, NP       ondansetron  (ZOFRAN ) tablet 4 mg  4 mg Oral Q6H PRN Emokpae, Ejiroghene E, MD       Or   ondansetron  (ZOFRAN ) injection 4 mg  4 mg Intravenous Q6H PRN Emokpae, Ejiroghene E, MD       polyethylene glycol (MIRALAX  / GLYCOLAX ) packet 17 g  17 g Oral Daily PRN Emokpae, Ejiroghene E, MD         Discharge Medications: Please see discharge summary for a list of discharge  medications.  Relevant Imaging Results:  Relevant Lab Results:   Additional Information SS#: [id number]  Tandy Fam, LCSW

## 2024-03-31 NOTE — TOC Progression Note (Addendum)
 Transition of Care [facility]) - Progression Note    Patient Details  Name: Steven Gibbs MRN: [id number] Date of Birth: 17-Feb-1946  Transition [facility]) CM/SW Contact  Tandy Fam, [place] Phone Number: 03/31/2024, 10:47 AM  Clinical Narrative:   CSW noting consult for patient's spouse desiring placement at [facility], if possible, for LTC with hospice. CSW contacted [facility], spoke with Maudine Sos and there are no LTC beds available at this time. CSW spoke with spouse, Steven Gibbs, to provide update. Steven Gibbs interested in other facilities in the area, if possible, at [facility] [employer]. CSW contacted [address], [address], and [address], and there are no LTC beds available at this time. CSW to update spouse and will continue search if desired.  UPDATE: CSW spoke with spouse, Steven Gibbs, to update on no beds available in [place] or [place]. Steven Gibbs in agreement to look in [place], she is just not sure that she can provide the care that he needs at home. CSW completed referral and faxed out, awaiting responses. CSW to follow.    Expected Discharge Plan: Skilled Nursing Facility Barriers to Discharge: Continued Medical Work up, Inadequate or no insurance, Hospice Bed not available, No SNF bed  Expected Discharge Plan and Services     Post Acute Care Choice: Skilled Nursing Facility Living arrangements for the past 2 months: Single Family Home                                       Social Determinants of Health (SDOH) Interventions SDOH Screenings   Transportation Needs: Patient Unable To Answer (03/27/2024)  Utilities: Patient Unable To Answer (03/27/2024)  Tobacco Use: Medium Risk (03/25/2024)    Readmission Risk Interventions     No data to display

## 2024-03-31 NOTE — Plan of Care (Signed)

## 2024-03-31 NOTE — Progress Notes (Signed)
 Daily Progress Note   Patient Name: Steven Gibbs       Date: 03/31/2024 DOB: 1946-06-03  Age: 78 y.o. MRN#: [id number] Attending Physician: Danette Duos, MD Primary Care Physician: Artemisa Bile, MD Admit Date: 03/25/2024  Reason for Consultation/Follow-up: Establishing goals of care  Patient Profile/HPI:   78 y.o. male  with past medical history of Parkinson's, dementia, multiple falls, HTN, HLD, admitted on 03/25/2024 with fall. Workup reveals acute fractures at C5, C6, fractured ribs, new brain mass, and aspiration pnuemonia. There are not good options for biopsy of brian lesion, and likely not good options for treatment given patient's advanced dementia. Palliative medicine consulted for GOC.   Discussion:  Chart reviewed including labs, progress notes, imaging from this and previous encounters.  Reviewed SLP note- patient has been started on pureed diet. CMET reviewed- stable.  Patient sleeping on my eval. I elected not to wake him for comfort.  Spoke with spouse via phone. She feels [name]'s mental status is improving some. He told her he loved her on the phone last night. She is disappointed no bed at [facility]. She is waiting to hear on possible bed at [facility] or [facility]. She is worried about quality of care he will receive in those facilities. We discussed that the more she or other family and friends can be present- will improve his experience there. We also discussed that patient's private caretaker can continue to help him at SNF and also the benefit of having hospice visit regularly.   Review of Systems  Unable to perform ROS: Other     Physical Exam Vitals and nursing note reviewed.   Cardiovascular:     Rate and Rhythm: Normal rate.  Pulmonary:     Effort:  Pulmonary effort is normal.   Musculoskeletal:     Comments: Neck brace in place   Neurological:     Comments: sleeping            Vital Signs: BP (!) 151/83 (BP Location: Left Arm)   Pulse (!) 42   Temp 97.8 F (36.6 C) (Oral)   Resp 18   Ht 5' 8 (1.727 m)   Wt 69 kg   SpO2 99%   BMI 23.13 kg/m  SpO2: SpO2: 99 % O2 Device: O2 Device: Room Air O2 Flow  Rate:    Intake/output summary:  Intake/Output Summary (Last 24 hours) at 03/31/2024 1054 Last data filed at 03/31/2024 0900 Gross per 24 hour  Intake 2586.75 ml  Output 1400 ml  Net 1186.75 ml   LBM: Last BM Date : 03/30/24 Baseline Weight: Weight: 69 kg Most recent weight: Weight: 69 kg       Palliative Assessment/Data: PPS: 30%      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) 03/25/2024   Fracture of cervical spinous process, initial encounter (HCC) 03/25/2024   Essential hypertension 03/25/2024   Mixed hyperlipidemia 03/25/2024   Screening for malignant neoplasm of colon 03/25/2024   Left ankle sprain 09/05/2012   Fracture of right ankle, lateral malleolus 09/05/2012   Pain in joint, ankle and foot 09/05/2012   Difficulty walking 09/05/2012    Palliative Care Assessment & Plan    Assessment/Recommendations/Plan  -Newly diagnosed brain lesion in the setting of advanced dementia and Parkinson's- no further diagnostic workup- focus on comfort measures -Aspiration pneumonia- complete antibiotic treatment- however, only treat symptomatically if recurrence -Poor po intake, risk for aspiration- start pureed diet- no artificial feeding -TOC consult placed for referral to hospice and attempt to get LTC bed -Spinal fractures, spinal edema, rib fractures- continue acetaminophen  650mg  po TID, continue morphine  concentrated solution 10mg  po q2hrs prn for severe pain, anxiety or shortness of breath   Code  Status:   Code Status: Limited: Do not attempt resuscitation (DNR) -DNR-LIMITED -Do Not Intubate/DNI    Prognosis:  < 6 months  Discharge Planning: Skilled Nursing Facility with Hospice  Care plan was discussed with [facility], spouse  Thank you for allowing the Palliative Medicine Team to assist in the care of this patient.  Total time:  Prolonged billing:  Time includes:   Preparing to see the patient (e.g., review of tests) Obtaining and/or reviewing separately obtained history Performing a medically necessary appropriate examination and/or evaluation Counseling and educating the patient/family/caregiver Ordering medications, tests, or procedures Referring and communicating with other health care professionals (when not reported separately) Documenting clinical information in the electronic or other health record Independently interpreting results (not reported separately) and communicating results to the patient/family/caregiver Care coordination (not reported separately) Clinical documentation  Micki Alas, AGNP-C Palliative Medicine   Please contact Palliative Medicine Team phone at [phone] for questions and concerns.

## 2024-03-31 NOTE — Progress Notes (Signed)
 PROGRESS NOTE    Steven Gibbs  UJW:[id number] DOB: [birth year] DOA: 03/25/2024 PCP: Artemisa Bile, MD   Brief Narrative: 78 year old with past medical history significant for Parkinson dementia, hypertension present weakness and fall.  Patient fell on the floor able to get him on the slip on the floor.  Next day he was complaining of pain.  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  Lumbar puncture performed by EDP on 6/10 with no concerning findings.  Transferred to [facility] for further evaluation  At baseline patient is sometimes able to recognize his wife, sometimes will answer simple questions, over the past 6 weeks patient had decline and has had multiple falls.  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 He has  baseline dementia, Parkinson's disease.  - MRI brain: Nodular enhancement left basal ganglia, left external capsule, area of gyral enhancement anterior left temporal and inferior frontal lobe, left hypothalamus.  -Neurosurgery evaluated patient, recommend neurology evaluation as well as workup.  These lesions are not amenable to  surgical resection. - 6/10 Underwent  LP: CSF: White blood cell 2: Lymphocyte 85% meningitis encephalitis panel negative, obtain 76. Lymphocyte flow cytology pending.  -EEG: Continuous slow generalized.  -Neurology consulted. Can stop Acyclovir .  -CT Chest , abdomen-pelvis: trace bilateral pleural effusion, posterior lower lobe atelectasis or PNA, Bronchia wall thickening.  Enlarged prostate indenting the floor of the bladder. Mild bladder wall thickening may be due to chronic outlet obstruction. Aortic Atherosclerosis   -There was not enough CSF fluid for cytology, VDRL and fungal culture not sent. Discussed with Dr Renaee Caro, no need to repeat LP; cytology is usually low yield, Syphilis  and fungal  less likely. He think patient is more palliative care, this could be inflammatory or Malignancy.  -Palliative care consulted. Plan to transition to rehab  with hospice care.  He is more alert, ate all his breakfast.   Aspiration PNA;  CT chest: Posterior lower lobe atelectasis or pneumonia. Bronchial wall thickening and occlusive debris throughout the left lower lobe bronchi favors aspiration. -Continue with  Unasyn   day 5   Fracture of the cervical spinous process Neurosurgery recommend C collar.   Essential hypertension Hold  Cardizem  and metoprolol  due to bradycardia.   Dementia, Parkinson's disease Continue with Aricept , Lexapro , Remeron .   Mildly displaced fractures of the right eighth through twelfth ribs. No evidence of underlying pathologic lesion. -Incentive spirometry  Estimated body mass index is 23.13 kg/m as calculated from the following:   Height as of this encounter: 5' 8 (1.727 m).   Weight as of this encounter: 69 kg.   DVT prophylaxis: SCD Code Status:  DNR, DNI Family Communication: care discussed with wife who was at bedside 6/14  Disposition Plan:  Status is: Inpatient Remains inpatient appropriate because: management of brain lesion, encephalopathy     Consultants:  Neurology Neurosurgery   Procedures:  LP 6/10  Antimicrobials:    Subjective: He is alert, confuse denies pain. Ate all the breakfast   Objective: Vitals:   03/30/24 2335 03/31/24 0346 03/31/24 0755 03/31/24 1209  BP: (!) 154/88 (!) 141/78 (!) 151/83 127/67  Pulse: (!) 50 (!) 44 (!) 42 (!) 47  Resp: 16 16 18 20   Temp: 97.7 F (36.5 C) (!) 97.5 F (36.4 C) 97.8 F (36.6 C) 97.9 F (36.6 C)  TempSrc: Oral  Oral Oral  SpO2: 96% 99% 99% 98%  Weight:      Height:        Intake/Output  Summary (Last 24 hours) at 03/31/2024 1343 Last data filed at 03/31/2024 0900 Gross per 24 hour  Intake 2586.75 ml  Output 1400 ml  Net 1186.75 ml   Filed Weights   03/25/24 1514  Weight: 69 kg    Examination:  General exam: Awake, Alert.  cervical collar  in placed.  Respiratory system: CTA Cardiovascular system: S 1, S 2 RRR Gastrointestinal system: BS present, soft, nt Central nervous system: Awake,  Extremities: No edema   Data Reviewed: I have personally reviewed following labs and imaging studies  CBC: Recent Labs  Lab 03/25/24 1539 03/26/24 0455 03/27/24 0553 03/28/24 1114  WBC 8.4 10.1 9.7 8.5  NEUTROABS 6.6  --  7.8*  --   HGB 13.7 13.1 14.0 12.7*  HCT 41.0 40.2 41.5 37.8*  MCV 89.7 88.5 87.7 86.9  PLT 353 293 336 322   Basic Metabolic Panel: Recent Labs  Lab 03/25/24 1539 03/26/24 0455 03/27/24 0553 03/28/24 1114 03/30/24 0649  NA 137 137 135 135 138  K 3.5 3.8 3.7 4.1 3.6  CL 100 100 98 99 99  CO2 28 27 29 26 27   GLUCOSE 96 96 96 99 94  BUN 12 10 9  7* 6*  CREATININE 0.63 0.66 0.74 0.73 0.63  CALCIUM 8.8* 8.7* 8.8* 8.4* 8.8*  MG  --   --  2.0  --   --    GFR: Estimated Creatinine Clearance: 77.2 mL/min (by C-G formula based on SCr of 0.63 mg/dL). Liver Function Tests: Recent Labs  Lab 03/25/24 1539 03/27/24 0553  AST 35 30  ALT 16 16  ALKPHOS 94 93  BILITOT 0.9 1.0  PROT 6.0* 5.9*  ALBUMIN 3.1* 2.8*   No results for input(s): LIPASE, AMYLASE in the last 168 hours. No results for input(s): AMMONIA in the last 168 hours. Coagulation Profile: No results for input(s): INR, PROTIME in the last 168 hours. Cardiac Enzymes: Recent Labs  Lab 03/25/24 1539  CKTOTAL 1,556*   BNP (last 3 results) No results for input(s): PROBNP in the last 8760 hours. HbA1C: No results for input(s): HGBA1C in the last 72 hours. CBG: Recent Labs  Lab 03/30/24 1159 03/30/24 1753 03/30/24 2334 03/31/24 0613 03/31/24 1205  GLUCAP 107*  139* 113* 111* 124*   Lipid Profile: No results for input(s): CHOL, HDL, LDLCALC, TRIG, CHOLHDL, LDLDIRECT in the last 72 hours. Thyroid  Function Tests: No results for input(s): TSH, T4TOTAL, FREET4, T3FREE, THYROIDAB in the last 72 hours. Anemia Panel: No results for input(s): VITAMINB12, FOLATE, FERRITIN, TIBC, IRON, RETICCTPCT in the last 72 hours. Sepsis Labs: No results for input(s): PROCALCITON, LATICACIDVEN in the last 168 hours.  Recent Results (from the past 240 hours)  CSF culture w Gram Stain     Status: None   Collection Time: 03/25/24 11:35 PM   Specimen: CSF; Cerebrospinal Fluid  Result Value Ref Range Status   Specimen Description   Final    CSF Performed at [facility], [address] [place], [place] [address]    Special Requests   Final    NONE Performed at [facility], [address], [place], [place] [address]    Gram Stain   Final  CYTOSPIN SMEAR WBC PRESENT, PREDOMINANTLY MONONUCLEAR NO ORGANISMS SEEN    Culture   Final    NO GROWTH 3 DAYS Performed at [facility], [address] [place], [place] [address]    Report Status 03/30/2024 FINAL  Final         Radiology Studies: No results found.       Scheduled Meds:  acetaminophen  (TYLENOL ) oral liquid 160 mg/5 mL  650 mg Oral TID   donepezil   10 mg Oral Daily   escitalopram   5 mg Oral Daily   [START ON 04/01/2024] metoprolol  succinate  25 mg Oral Daily   mirtazapine   15 mg Oral QHS   Continuous Infusions:  ampicillin -sulbactam (UNASYN ) IV 3 g (03/31/24 1226)     LOS: 6 days    Time spent: 35 minutes    Anetha Slagel A Jaxon Mynhier, MD [facility]   If 7PM-7AM, please contact night-coverage [email or web address]  03/31/2024, 1:43 PM

## 2024-04-01 DIAGNOSIS — Z515 Encounter for palliative care: Secondary | ICD-10-CM

## 2024-04-01 DIAGNOSIS — G20A1 Parkinson's disease without dyskinesia, without mention of fluctuations: Secondary | ICD-10-CM | POA: Diagnosis not present

## 2024-04-01 DIAGNOSIS — S129XXA Fracture of neck, unspecified, initial encounter: Secondary | ICD-10-CM | POA: Diagnosis not present

## 2024-04-01 DIAGNOSIS — G939 Disorder of brain, unspecified: Secondary | ICD-10-CM | POA: Diagnosis not present

## 2024-04-01 LAB — GLUCOSE, CAPILLARY
Glucose-Capillary: 67 mg/dL — ABNORMAL LOW (ref 70–99)
Glucose-Capillary: 72 mg/dL (ref 70–99)
Glucose-Capillary: 74 mg/dL (ref 70–99)
Glucose-Capillary: 81 mg/dL (ref 70–99)
Glucose-Capillary: 84 mg/dL (ref 70–99)

## 2024-04-01 LAB — BASIC METABOLIC PANEL WITH GFR
Anion gap: 7 (ref 5–15)
BUN: 9 mg/dL (ref 8–23)
CO2: 28 mmol/L (ref 22–32)
Calcium: 8.5 mg/dL — ABNORMAL LOW (ref 8.9–10.3)
Chloride: 104 mmol/L (ref 98–111)
Creatinine, Ser: 0.76 mg/dL (ref 0.61–1.24)
GFR, Estimated: >60 mL/min (ref >60)
Glucose, Bld: 90 mg/dL (ref 70–99)
Potassium: 3.5 mmol/L (ref 3.5–5.1)
Sodium: 139 mmol/L (ref 135–145)

## 2024-04-01 LAB — IGG CSF INDEX
Albumin CSF-mCnc: 45 mg/dL (ref 15–55)
Albumin: 3.3 g/dL — ABNORMAL LOW (ref 3.8–4.8)
CSF IgG Index: 0.7 (ref 0.0–0.7)
IgG (Immunoglobin G), Serum: 787 mg/dL (ref 603–1613)
IgG, CSF: 7.3 mg/dL (ref 0.0–10.3)
IgG/Alb Ratio, CSF: 0.16 (ref 0.00–0.25)

## 2024-04-01 NOTE — Progress Notes (Signed)
 SLP Cancellation Note  Patient Details Name: Steven Gibbs MRN: [id number] DOB: [birth year]   Cancelled treatment:       Reason Eval/Treat Not Completed: Other (comment). Given tolerance of diet and plan to focus on comfort, no further SLP acute needs will sign off.    Aulani Shipton, Hardin Leys 04/01/2024, 1:08 PM

## 2024-04-01 NOTE — Progress Notes (Signed)
 Daily Progress Note   Patient Name: Steven Gibbs       Date: 04/01/2024 DOB: 10-26-1945  Age: 78 y.o. MRN#: [id number] Attending Physician: Danette Duos, MD Primary Care Physician: Artemisa Bile, MD Admit Date: 03/25/2024  Reason for Consultation/Follow-up: Establishing goals of care  Patient Profile/HPI:   78 y.o. male  with past medical history of Parkinson's, dementia, multiple falls, HTN, HLD, admitted on 03/25/2024 with fall. Workup reveals acute fractures at C5, C6, fractured ribs, new brain mass, and aspiration pnuemonia. There are not good options for biopsy of brian lesion, and likely not good options for treatment given patient's advanced dementia. Palliative medicine consulted for GOC.   Discussion:  Chart reviewed including labs, progress notes, imaging from this and previous encounters.  Noted LCSW note- working on finding available LTC bed.  Patient sleeping on my eval. I elected not to wake him for comfort.  He required 2 doses of prn morphine  solution in the last 24 hours.  No family at bedside.   Review of Systems  Unable to perform ROS: Other     Physical Exam Vitals and nursing note reviewed.   Cardiovascular:     Rate and Rhythm: Normal rate.  Pulmonary:     Effort: Pulmonary effort is normal.   Musculoskeletal:     Comments: Neck brace in place   Neurological:     Comments: sleeping            Vital Signs: BP (!) 142/73 (BP Location: Right Arm)   Pulse (!) 51   Temp 97.7 F (36.5 C) (Oral)   Resp 14   Ht 5' 8 (1.727 m)   Wt 69 kg   SpO2 100%   BMI 23.13 kg/m  SpO2: SpO2: 100 % O2 Device: O2 Device: Room Air O2 Flow Rate:    Intake/output summary:  Intake/Output Summary (Last 24 hours) at 04/01/2024 1427 Last data filed at 04/01/2024  0636 Gross per 24 hour  Intake 400 ml  Output --  Net 400 ml   LBM: Last BM Date : 03/31/24 Baseline Weight: Weight: 69 kg Most recent weight: Weight: 69 kg       Palliative Assessment/Data: PPS: 30%      Patient Active Problem List   Diagnosis Date Noted   Brain lesion 03/25/2024   Multiple  falls 03/25/2024   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) 03/25/2024   Fracture of cervical spinous process, initial encounter (HCC) 03/25/2024   Essential hypertension 03/25/2024   Mixed hyperlipidemia 03/25/2024   Screening for malignant neoplasm of colon 03/25/2024   Left ankle sprain 09/05/2012   Fracture of right ankle, lateral malleolus 09/05/2012   Pain in joint, ankle and foot 09/05/2012   Difficulty walking 09/05/2012    Palliative Care Assessment & Plan    Assessment/Recommendations/Plan  -Newly diagnosed brain lesion in the setting of advanced dementia and Parkinson's- no further diagnostic workup- focus on comfort measures- continue prn morphine  solution- no need for adjustements -Aspiration pneumonia- complete antibiotic treatment- however, only treat symptomatically if recurrence -Poor po intake, risk for aspiration- start pureed diet- no artificial feeding -TOC consult placed for referral to hospice and attempt to get LTC bed -Spinal fractures, spinal edema, rib fractures- continue acetaminophen  650mg  po TID, continue morphine  concentrated solution 10mg  po q2hrs prn for severe pain, anxiety or shortness of breath   Code Status:   Code Status: Limited: Do not attempt resuscitation (DNR) -DNR-LIMITED -Do Not Intubate/DNI    Prognosis:  < 6 months  Discharge Planning: Skilled Nursing Facility with Hospice  Care plan was discussed with [facility], spouse  Thank you for allowing the Palliative Medicine Team to assist in the care of this patient.  Total time:  Prolonged billing:  Time includes:   Preparing to see the patient (e.g., review of tests) Obtaining and/or  reviewing separately obtained history Performing a medically necessary appropriate examination and/or evaluation Counseling and educating the patient/family/caregiver Ordering medications, tests, or procedures Referring and communicating with other health care professionals (when not reported separately) Documenting clinical information in the electronic or other health record Independently interpreting results (not reported separately) and communicating results to the patient/family/caregiver Care coordination (not reported separately) Clinical documentation  Micki Alas, AGNP-C Palliative Medicine   Please contact Palliative Medicine Team phone at [phone] for questions and concerns.

## 2024-04-01 NOTE — Progress Notes (Addendum)
 PROGRESS NOTE    Steven Gibbs  YQM:[id number] DOB: [birth year] DOA: 03/25/2024 PCP: Artemisa Bile, MD   Brief Narrative: 78 year old with past medical history significant for Parkinson dementia, hypertension present weakness and fall.  Patient fell on the floor able to get him on the slip on the floor.  Next day he was complaining of pain.  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  Lumbar puncture performed by EDP on 6/10 with no concerning findings.  Transferred to [facility] for further evaluation  At baseline patient is sometimes able to recognize his wife, sometimes will answer simple questions, over the past 6 weeks patient had decline and has had multiple falls. Plan to discharge with hospice, to SNF, long term care.   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 He has  baseline dementia, Parkinson's disease.  - MRI brain: Nodular enhancement left basal ganglia, left external capsule, area of gyral enhancement anterior left temporal and inferior frontal lobe, left hypothalamus.  -Neurosurgery evaluated patient, recommend neurology evaluation as well as workup.  These lesions are not amenable to  surgical resection. - 6/10 Underwent  LP: CSF: White blood cell 2: Lymphocyte 85% meningitis encephalitis panel negative, obtain 76. Lymphocyte flow cytology pending. IgG: Index pending.  -EEG: Continuous slow generalized.  -Neurology consulted. Can stop Acyclovir .  -CT Chest , abdomen-pelvis: trace bilateral pleural effusion, posterior lower lobe atelectasis or PNA, Bronchia wall thickening.  Enlarged prostate indenting the floor of the bladder. Mild bladder wall  thickening may be due to chronic outlet obstruction. Aortic Atherosclerosis  -There was not enough CSF fluid for cytology, VDRL and fungal culture not sent. Discussed with Dr Renaee Caro, no need to repeat LP; cytology is usually low yield, Syphilis  and fungal  less likely. He think patient is more palliative care, this could be inflammatory or Malignancy.  -Palliative care consulted. Plan to transition to rehab/long term care  with hospice care.  He is more alert, ate all his breakfast.  -Wife would not want to proceed with Bx, plan to discharge SNF with hospice.   Aspiration PNA;  CT chest: Posterior lower lobe atelectasis or pneumonia. Bronchial wall thickening and occlusive debris throughout the left lower lobe bronchi favors aspiration. -Continue with  Unasyn   day 6/7   Fracture of the cervical spinous process Neurosurgery recommend C collar.   Essential hypertension Hold  Cardizem  and metoprolol  due to bradycardia.   Dementia, Parkinson's disease Continue with Aricept , Lexapro , Remeron .   Mildly displaced fractures of the right eighth through twelfth ribs. No evidence of underlying pathologic lesion. -Incentive spirometry  Estimated body mass index is 23.13 kg/m as calculated from the following:   Height as of this encounter: 5' 8 (1.727 m).   Weight as of this encounter: 69 kg.   DVT prophylaxis: SCD Code Status:  DNR, DNI Family Communication: care discussed with wife who was at bedside 6/14  Disposition Plan:  Status is: Inpatient Remains inpatient appropriate because: management of brain lesion, encephalopathy     Consultants:  Neurology Neurosurgery   Procedures:  LP 6/10  Antimicrobials:    Subjective: He is alert, confuse, say few words.   Objective: Vitals:  04/01/24 0018 04/01/24 0411 04/01/24 0725 04/01/24 1233  BP: (!) 140/70 (!) 142/70 (!) 149/76 (!) 142/73  Pulse:  (!) 53 (!) 52 (!) 51  Resp: 16 14 16 14   Temp: 98 F (36.7 C) 97.8 F (36.6  C) 97.8 F (36.6 C) 97.7 F (36.5 C)  TempSrc:  Oral Oral Oral  SpO2: 94% 100% 100% 100%  Weight:      Height:        Intake/Output Summary (Last 24 hours) at 04/01/2024 1352 Last data filed at 04/01/2024 0636 Gross per 24 hour  Intake 400 ml  Output --  Net 400 ml   Filed Weights   03/25/24 1514  Weight: 69 kg    Examination:  General exam: Awake, has cervical collar.  Respiratory system: CTA Cardiovascular system: S1, S2  RRR Gastrointestinal system: BS present, soft, nt Central nervous system: Awake Extremities: No edema   Data Reviewed: I have personally reviewed following labs and imaging studies  CBC: Recent Labs  Lab 03/25/24 1539 03/26/24 0455 03/27/24 0553 03/28/24 1114  WBC 8.4 10.1 9.7 8.5  NEUTROABS 6.6  --  7.8*  --   HGB 13.7 13.1 14.0 12.7*  HCT 41.0 40.2 41.5 37.8*  MCV 89.7 88.5 87.7 86.9  PLT 353 293 336 322   Basic Metabolic Panel: Recent Labs  Lab 03/26/24 0455 03/27/24 0553 03/28/24 1114 03/30/24 0649 04/01/24 0427  NA 137 135 135 138 139  K 3.8 3.7 4.1 3.6 3.5  CL 100 98 99 99 104  CO2 27 29 26 27 28   GLUCOSE 96 96 99 94 90  BUN 10 9 7* 6* 9  CREATININE 0.66 0.74 0.73 0.63 0.76  CALCIUM 8.7* 8.8* 8.4* 8.8* 8.5*  MG  --  2.0  --   --   --    GFR: Estimated Creatinine Clearance: 77.2 mL/min (by C-G formula based on SCr of 0.76 mg/dL). Liver Function Tests: Recent Labs  Lab 03/25/24 1539 03/27/24 0553  AST 35 30  ALT 16 16  ALKPHOS 94 93  BILITOT 0.9 1.0  PROT 6.0* 5.9*  ALBUMIN 3.1* 2.8*   No results for input(s): LIPASE, AMYLASE in the last 168 hours. No results for input(s): AMMONIA in the last 168 hours. Coagulation Profile: No results for input(s): INR, PROTIME in the last 168 hours. Cardiac Enzymes: Recent Labs  Lab 03/25/24 1539  CKTOTAL 1,556*   BNP (last 3 results) No results for input(s): PROBNP in the last 8760 hours. HbA1C: No results for input(s): HGBA1C in the last 72  hours. CBG: Recent Labs  Lab 03/31/24 1205 03/31/24 1751 04/01/24 0018 04/01/24 0611 04/01/24 1232  GLUCAP 124* 122* 81 74 84   Lipid Profile: No results for input(s): CHOL, HDL, LDLCALC, TRIG, CHOLHDL, LDLDIRECT in the last 72 hours. Thyroid  Function Tests: No results for input(s): TSH, T4TOTAL, FREET4, T3FREE, THYROIDAB in the last 72 hours. Anemia Panel: No results for input(s): VITAMINB12, FOLATE, FERRITIN, TIBC, IRON, RETICCTPCT in the last 72 hours. Sepsis Labs: No results for input(s): PROCALCITON, LATICACIDVEN in the last 168 hours.  Recent Results (from the past 240 hours)  CSF culture w Gram Stain     Status: None   Collection Time: 03/25/24 11:35 PM   Specimen: CSF; Cerebrospinal Fluid  Result Value Ref Range Status   Specimen Description   Final    CSF Performed at [facility], [address], [place], [place] [address]    Special Requests   Final    NONE Performed  at [facility], [address], [place], [place] [address]    Gram Stain   Final    CYTOSPIN SMEAR WBC PRESENT, PREDOMINANTLY MONONUCLEAR NO ORGANISMS SEEN    Culture   Final    NO GROWTH 3 DAYS Performed at [facility], [address], [place], [place] [address]    Report Status 03/30/2024 FINAL  Final         Radiology Studies: No results found.       Scheduled Meds:  acetaminophen  (TYLENOL ) oral liquid 160 mg/5 mL  650 mg Oral TID   donepezil   10 mg Oral Daily   escitalopram   5 mg Oral Daily   metoprolol  succinate  25 mg Oral Daily   mirtazapine   15 mg Oral QHS   Continuous Infusions:  ampicillin -sulbactam (UNASYN ) IV 3 g (04/01/24 1216)     LOS: 7 days    Time spent: 35 minutes    Tyleah Loh A Shawnique Mariotti, MD [facility]   If 7PM-7AM, please contact night-coverage [email or web address]  04/01/2024, 1:52 PM

## 2024-04-01 NOTE — TOC Progression Note (Signed)
 Transition of Care [facility]) - Progression Note    Patient Details  Name: Steven Gibbs MRN: [id number] Date of Birth: Dec 22, 1945  Transition of Care [facility]) CM/SW Contact  Tandy Fam, [place] Phone Number: 04/01/2024, 4:23 PM  Clinical Narrative:   CSW received voicemail from wife after hours asking about [facility] for LTC. CSW coordinated with [facility] and confirmed they would have a bed. CSW updated spouse, she went for a tour and took LTC insurance paperwork. CSW received call from spouse after the tour that she did not want to send her husband there, she was not happy after the tour and she would still have to pay $5,000 above the LTC insurance policy and she didn't think it was worth the money. She is considering bringing the patient home with hospice instead, requested time to think about it and talk with family to make sure she could do it and will update CSW in the morning tomorrow with a final decision. Patient would need a hospital bed and hoyer lift for home. CSW to follow.    Expected Discharge Plan: Skilled Nursing Facility Barriers to Discharge: Continued Medical Work up, Inadequate or no insurance, Hospice Bed not available, No SNF bed  Expected Discharge Plan and Services     Post Acute Care Choice: Skilled Nursing Facility Living arrangements for the past 2 months: Single Family Home                                       Social Determinants of Health (SDOH) Interventions SDOH Screenings   Transportation Needs: Patient Unable To Answer (03/27/2024)  Utilities: Patient Unable To Answer (03/27/2024)  Tobacco Use: Medium Risk (03/25/2024)    Readmission Risk Interventions     No data to display

## 2024-04-01 NOTE — Progress Notes (Signed)
 Physical Therapy Treatment Patient Details Name: Steven Gibbs MRN: [id number] DOB: May 14, 1946 Today's Date: 04/01/2024   History of Present Illness Pt is a 78 yo male presenting to [facility] on 03/25/24 for weakness and falls, MRI determined brain lesion of the L temporal and frontal lobes expanding to the BG. Also found to have acute fractures at C5 and C6 spinous processes. PMH of dementia, Parkinsons, HTN, hx of falls, cervical fx.    PT Comments  Pt with fair tolerance to treatment today. Pt with similar presentation to previous session. +2 total A to stand and take sidesteps. No change in DC/DME recs at this time. PT will continue to follow.     If plan is discharge home, recommend the following: Two people to help with walking and/or transfers;A lot of help with bathing/dressing/bathroom;Assistance with cooking/housework;Direct supervision/assist for medications management;Direct supervision/assist for financial management;Assist for transportation;Help with stairs or ramp for entrance;Supervision due to cognitive status   Can travel by private vehicle     No  Equipment Recommendations  Hoyer lift;Hospital bed    Recommendations for Other Services       Precautions / Restrictions Precautions Precautions: Fall Recall of Precautions/Restrictions: Impaired Precaution/Restrictions Comments: hx of Parkinson's and Dementia Required Braces or Orthoses: Cervical Brace Cervical Brace: Hard collar;At all times Restrictions Weight Bearing Restrictions Per Provider Order: No     Mobility  Bed Mobility Overal bed mobility: Needs Assistance Bed Mobility: Supine to Sit, Sit to Sidelying     Supine to sit: Total assist, +2 for safety/equipment, +2 for physical assistance   Sit to sidelying: +2 for safety/equipment, Total assist, +2 for physical assistance General bed mobility comments: helicopter method.    Transfers Overall transfer level: Needs assistance Equipment used: 2 person hand  held assist Transfers: Sit to/from Stand Sit to Stand: +2 safety/equipment, +2 physical assistance, Total assist           General transfer comment: +2 total A to stand and take sidesteps.    Ambulation/Gait               General Gait Details: Deferred for safety   Stairs             Wheelchair Mobility     Tilt Bed    Modified Rankin (Stroke Patients Only)       Balance Overall balance assessment: Needs assistance Sitting-balance support: Feet supported Sitting balance-Leahy Scale: Poor Sitting balance - Comments: brief period of Min A but grossly mod A for static sitting. Postural control: Posterior lean Standing balance support: Bilateral upper extremity supported Standing balance-Leahy Scale: Zero Standing balance comment: reliant on therapists.                            Communication Communication Communication: Impaired Factors Affecting Communication: Other (comment) (mostly nonverbal)  Cognition Arousal: Lethargic Behavior During Therapy: Flat affect   PT - Cognitive impairments: History of cognitive impairments                         Following commands: Impaired Following commands impaired: Follows one step commands inconsistently    Cueing Cueing Techniques: Verbal cues, Gestural cues, Tactile cues, Visual cues  Exercises      General Comments General comments (skin integrity, edema, etc.): VSS      Pertinent Vitals/Pain Pain Assessment Pain Assessment: Faces Faces Pain Scale: No hurt    Home Living  Prior Function            PT Goals (current goals can now be found in the care plan section) Progress towards PT goals: Progressing toward goals    Frequency    Min 2X/week      PT Plan      Co-evaluation              AM-PAC PT 6 Clicks Mobility   Outcome Measure  Help needed turning from your back to your side while in a flat bed without using  bedrails?: Total Help needed moving from lying on your back to sitting on the side of a flat bed without using bedrails?: Total Help needed moving to and from a bed to a chair (including a wheelchair)?: Total Help needed standing up from a chair using your arms (e.g., wheelchair or bedside chair)?: Total Help needed to walk in hospital room?: Total Help needed climbing 3-5 steps with a railing? : Total 6 Click Score: 6    End of Session Equipment Utilized During Treatment: Gait belt Activity Tolerance: Patient limited by lethargy Patient left: in bed;with call bell/phone within reach;with bed alarm set Nurse Communication: Mobility status;Need for lift equipment;Other (comment) PT Visit Diagnosis: Unsteadiness on feet (R26.81);Other abnormalities of gait and mobility (R26.89)     Time: 0454-0981 PT Time Calculation (min) (ACUTE ONLY): 12 min  Charges:    $Therapeutic Activity: 8-22 mins PT General Charges $$ ACUTE PT VISIT: 1 Visit                     Steven Gibbs, PT, DPT Acute Rehab Services [phone]    Steven Gibbs 04/01/2024, 3:51 PM

## 2024-04-02 DIAGNOSIS — G939 Disorder of brain, unspecified: Secondary | ICD-10-CM | POA: Diagnosis not present

## 2024-04-02 LAB — GLUCOSE, CAPILLARY
Glucose-Capillary: 75 mg/dL (ref 70–99)
Glucose-Capillary: 76 mg/dL (ref 70–99)
Glucose-Capillary: 73 mg/dL (ref 70–99)

## 2024-04-02 MED ORDER — METOPROLOL SUCCINATE ER 25 MG PO TB24: 25.0000 mg | ORAL_TABLET | Freq: Every day | ORAL | Status: AC

## 2024-04-02 MED ORDER — MORPHINE SULFATE (CONCENTRATE) 10 MG /0.5 ML PO SOLN: 10.0000 mg | ORAL | 0 refills | Status: AC | PRN

## 2024-04-02 MED ORDER — ENSURE PLUS HIGH PROTEIN PO LIQD
237.0000 mL | Freq: Two times a day (BID) | ORAL | Status: DC
  Administered 2024-04-02 – 2024-04-03 (×2): 237 mL via ORAL

## 2024-04-02 NOTE — TOC Progression Note (Signed)
 Transition of Care [facility]) - Progression Note    Patient Details  Name: Steven Gibbs MRN: [id number] Date of Birth: 1946-08-09  Transition of Care [facility]) CM/SW Contact  Jonathan Neighbor, RN Phone Number: 04/02/2024, 11:16 AM  Clinical Narrative:     Wife has decided on home with hospice services. CM spoke with her and she prefers to use [facility] for home hospice services. CM has called Fredrica Jeans with [facility] and provided the referral and updated her on DME needs: hospital bed, hoyer, over the bed table. Fredrica Jeans hopes to have the DME to the home today. Pt will need the DME in the home prior to d/c. MD updated. Pt will need PTAR home. TOC following.  Expected Discharge Plan: Skilled Nursing Facility Barriers to Discharge: Continued Medical Work up, Inadequate or no insurance, Hospice Bed not available, No SNF bed  Expected Discharge Plan and Services     Post Acute Care Choice: Skilled Nursing Facility Living arrangements for the past 2 months: Single Family Home                                       Social Determinants of Health (SDOH) Interventions SDOH Screenings   Transportation Needs: Patient Unable To Answer (03/27/2024)  Utilities: Patient Unable To Answer (03/27/2024)  Tobacco Use: Medium Risk (03/25/2024)    Readmission Risk Interventions     No data to display

## 2024-04-02 NOTE — Plan of Care (Signed)

## 2024-04-02 NOTE — Progress Notes (Signed)
 Occupational Therapy Treatment Patient Details Name: Steven Gibbs MRN: [id number] DOB: 13-May-1946 Today's Date: 04/02/2024   History of present illness Pt is a 78 yo male presenting to [facility] on 03/25/24 for weakness and falls, MRI determined brain lesion of the L temporal and frontal lobes expanding to the BG. Also found to have acute fractures at C5 and C6 spinous processes. PMH of dementia, Parkinsons, HTN, hx of falls, cervical fx.   OT comments  Pt with similar presentation compared to IE, he maintains a flat affect and has very limited command following ( almost none). Pt did display the ability to engage in ADLs by beginning with downgraded for of assistance then progressing to have pt perform a subportion of the task himself. He maintains a very strong grip and is sometimes resistive to ROM but can be soothed by gentle touch. OT to continue to progress pt as able. Patient will benefit from continued inpatient follow up therapy, <3 hours/day       If plan is discharge home, recommend the following:  Two people to help with walking and/or transfers;Two people to help with bathing/dressing/bathroom;Direct supervision/assist for medications management;Assistance with cooking/housework;Direct supervision/assist for financial management;Assistance with feeding;Assist for transportation;Supervision due to cognitive status   Equipment Recommendations  Other (comment) (defer)    Recommendations for Other Services      Precautions / Restrictions Precautions Precautions: Fall Recall of Precautions/Restrictions: Impaired Precaution/Restrictions Comments: hx of Parkinson's and Dementia, Strong grabber Required Braces or Orthoses: Cervical Brace Cervical Brace: Hard collar;At all times Restrictions Weight Bearing Restrictions Per Provider Order: No       Mobility Bed Mobility Overal bed mobility: Needs Assistance Bed Mobility: Supine to Sit, Sit to Sidelying     Supine to sit: Total  assist, +2 for safety/equipment, +2 for physical assistance   Sit to sidelying: +2 for safety/equipment, Total assist, +2 for physical assistance      Transfers Overall transfer level: Needs assistance Equipment used: 2 person hand held assist Transfers: Sit to/from Stand Sit to Stand: +2 safety/equipment, +2 physical assistance, Total assist           General transfer comment: OT assisting pt with chest up and hips tucked inward     Balance Overall balance assessment: Needs assistance Sitting-balance support: Feet supported Sitting balance-Leahy Scale: Poor Sitting balance - Comments: able to progress to min A sitting balance, brief periods of CGA Postural control: Posterior lean Standing balance support: Bilateral upper extremity supported Standing balance-Leahy Scale: Zero Standing balance comment: reliant on therapists.                           ADL either performed or assessed with clinical judgement   ADL       Grooming: Wash/dry face;Wash/dry hands;Maximal assistance;Oral care                                 General ADL Comments: pt able to engage in the list ADLs with OT performing total A then transitioning pt to hand over hand assist, pt will finally make effort to perform some of task himself. Total A for other ADLs using RUE.    Extremity/Trunk Assessment Upper Extremity Assessment Upper Extremity Assessment: Difficult to assess due to impaired cognition;Right hand dominant (bilat grip strength is strong. able to use RUE to reach for objects on tray ~70 deg shoulder AROM. can bring RUE to  face, LUE also able to touch face actively)       Cervical / Trunk Assessment Cervical / Trunk Assessment: Other exceptions Cervical / Trunk Exceptions: Cervical Fxs    Vision       Perception     Praxis     Communication Communication Communication: Impaired Factors Affecting Communication: Other (comment) (mostly nonverbal)    Cognition Arousal: Lethargic Behavior During Therapy: Flat affect Cognition: History of cognitive impairments             OT - Cognition Comments: hx of dementia, pt mostly non verbal and following just 2 commands/prompts                 Following commands: Impaired Following commands impaired: Follows one step commands inconsistently      Cueing   Cueing Techniques: Verbal cues, Gestural cues, Tactile cues, Visual cues  Exercises      Shoulder Instructions       General Comments VSS; spouse present and supportive. Notified CSW the need for their assistance as spouse had TOC questions.    Pertinent Vitals/ Pain       Pain Assessment Pain Assessment: PAINAD Breathing: normal Negative Vocalization: none Facial Expression: sad, frightened, frown Body Language: tense, distressed pacing, fidgeting Consolability: distracted or reassured by voice/touch PAINAD Score: 3 Pain Intervention(s): Monitored during session  Home Living                                          Prior Functioning/Environment              Frequency  Min 2X/week        Progress Toward Goals  OT Goals(current goals can now be found in the care plan section)  Progress towards OT goals: Progressing toward goals  Acute Rehab OT Goals Patient Stated Goal: to get better OT Goal Formulation: With patient Time For Goal Achievement: 04/11/24 Potential to Achieve Goals: Fair  Plan      Co-evaluation                 AM-PAC OT 6 Clicks Daily Activity     Outcome Measure   Help from another person eating meals?: Total Help from another person taking care of personal grooming?: A Lot Help from another person toileting, which includes using toliet, bedpan, or urinal?: Total Help from another person bathing (including washing, rinsing, drying)?: Total Help from another person to put on and taking off regular upper body clothing?: Total Help from another  person to put on and taking off regular lower body clothing?: Total 6 Click Score: 7    End of Session Equipment Utilized During Treatment: Gait belt  OT Visit Diagnosis: Other symptoms and signs involving cognitive function;Other abnormalities of gait and mobility (R26.89);Unsteadiness on feet (R26.81);Muscle weakness (generalized) (M62.81)   Activity Tolerance Patient tolerated treatment well   Patient Left in bed;with call bell/phone within reach;with bed alarm set;with family/visitor present   Nurse Communication Mobility status        Time: 1610-9604 OT Time Calculation (min): 33 min  Charges: OT General Charges $OT Visit: 1 Visit OT Treatments $Self Care/Home Management : 8-22 mins $Therapeutic Activity: 8-22 mins  04/02/2024  AB, OTR/L  Acute Rehabilitation Services  Office: [phone]    Steven Gibbs 04/02/2024, 2:18 PM

## 2024-04-02 NOTE — Discharge Summary (Signed)
 Physician Discharge Summary  Steven Gibbs FMW:[id number] DOB: 01-31-1946 DOA: 03/25/2024  PCP: Sheryle Carwin, MD  Admit date: 03/25/2024 Discharge date: 04/03/2024  Admitted From: home Disposition:  home with hospice  Recommendations for Outpatient Follow-up:  Follow up with hospice MD  Home Health: hospice  Discharge Condition: stable CODE STATUS: DNR Diet Orders (From admission, onward)     Start     Ordered   03/30/24 1436  DIET - DYS 1 Room service appropriate? No; Fluid consistency: Thin  Diet effective now       Comments: Comfort feeds with allowance of additional consistencies as pt/family desires for comfort.  Question Answer Comment  Room service appropriate? No   Fluid consistency: Thin      03/30/24 1436            Brief Narrative / Interim history: 78 year old with past medical history significant for Parkinson dementia, hypertension present weakness and fall.  Patient fell on the floor able to get him on the slip on the floor.  Next day he was complaining of pain.  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  Lumbar puncture performed by EDP on 6/10 with no concerning findings.  Transferred to [facility] for further evaluation. At baseline patient is sometimes able to recognize his wife, sometimes will answer simple questions, over the past 6 weeks patient had decline and has had multiple falls.  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  He was admitted with frequent falls and mental status has been declining, has been sleepier and more confused.  Underwent an MRI of the brain which  showed nodular enhancement of the left basal ganglia, left external capsule, area of gyral enhancement anterior left temporal and inferior frontal lobe, left hypothalamus.  Neurosurgery and neurology evaluated, this lesions are not amenable to surgical resection.  An EEG showed generalized slowing.  An LP was negative for meningitis, encephalitis panel was negative.  CT of the chest abdomen pelvis showed trace bilateral pleural effusion, posterior lower lobe atelectasis or PNA, Bronchia wall thickening.  Enlarged prostate indenting the floor of the bladder. Mild bladder wall thickening may be due to chronic outlet obstruction.  Given lack of reversible findings, underlying dementia, palliative care consulted and plans are in place for patient to go home with hospice.   Active problems  Aspiration PNA -has completed 7 days of antibiotics while hospitalized Fracture of the cervical spinous process - Neurosurgery recommend C collar.  Essential hypertension - Hold Cardizem , metoprolol  dose was lowered due to bradycardia, tolerating it well Dementia, Parkinson's disease - Continue with Aricept , Lexapro , Remeron .  Mildly displaced fractures of the right eighth through twelfth ribs. No evidence of underlying pathologic lesion -Incentive spirometry  Sepsis ruled out   Discharge Instructions   Allergies as of 04/03/2024   No Known Allergies      Medication List     STOP taking these medications    diltiazem  180 MG 24 hr capsule Commonly known as: DILACOR XR        TAKE these medications    atorvastatin 10 MG tablet Commonly known as: LIPITOR Take 10 mg by mouth daily.   B-12  PO Take 1,000 mcg by mouth daily.   donepezil  10 MG tablet Commonly known as: ARICEPT  Take 10 mg by mouth daily.   escitalopram  10 MG tablet Commonly known as: LEXAPRO  Take 5 mg by mouth daily.   metoprolol  succinate 25 MG 24 hr tablet Commonly known as: TOPROL -XL Take 1 tablet (25 mg total) by mouth  daily. What changed:  medication strength how much to take additional instructions   mirtazapine  15 MG tablet Commonly known as: REMERON  Take 15 mg by mouth at bedtime.   morphine  CONCENTRATE 10 mg / 0.5 ml concentrated solution Take 0.5 mLs (10 mg total) by mouth every 2 (two) hours as needed for moderate pain (pain score 4-6), severe pain (pain score 7-10), anxiety or shortness of breath.   thiamine 100 MG tablet Commonly known as: Vitamin B-1 Take 100 mg by mouth daily.               Durable Medical Equipment  (From admission, onward)           Start     Ordered   04/02/24 1114  For home use only DME Overbed table  Once        04/02/24 1113   04/02/24 1113  For home use only DME Hospital bed  Once       Question Answer Comment  Length of Need 6 Months   The above medical condition requires: Patient requires the ability to reposition frequently   Head must be elevated greater than: 30 degrees   Bed type Semi-electric   Hoyer Lift Yes      04/02/24 1113            Contact information for after-discharge care     Destination     [facility] .   Service: Skilled Nursing Contact information: [address] [place]   [address] [phone]                     Consultations: Palliative Neurology Neurosurgery  Procedures/Studies:  EEG adult Result Date: 17-Apr-2024 Shelton Arlin KIDD, MD     04-17-24 12:29 PM Patient Name: Steven Gibbs MRN: [id number] Epilepsy Attending: Arlin KIDD Shelton Referring Physician/Provider: Franky Redia SAILOR, MD Date: 04/17/24 Duration: 29.41 mins Patient history:  78 yo male with unusual lesion of the left anterior temporal lobe, basal ganglia, bilateral hypothalamus. EEG to evaluate for seizure Level of alertness: Awake AEDs during EEG study: None Technical aspects: This EEG study was done with scalp electrodes positioned according to the 10-20 International system of electrode  placement. Electrical activity was reviewed with band pass filter of 1-70Hz , sensitivity of 7 uV/mm, display speed of 70mm/sec with a 60Hz  notched filter applied as appropriate. EEG data were recorded continuously and digitally stored.  Video monitoring was available and reviewed as appropriate. Description: EEG showed continuous generalized 3 to 6 Hz theta-delta slowing. Hyperventilation and photic stimulation were not performed.   ABNORMALITY - Continuous slow, generalized IMPRESSION: This study is suggestive of moderate diffuse encephalopathy. No seizures or epileptiform discharges were seen throughout the recording. Arlin KIDD Shelton   CT CHEST ABDOMEN PELVIS W CONTRAST Result Date: 03/26/2024 CLINICAL DATA:  Staging metastasis for musculoskeletal neoplasm. EXAM: CT CHEST, ABDOMEN, AND PELVIS WITH CONTRAST TECHNIQUE: Multidetector CT imaging of the chest, abdomen and pelvis was performed following the standard protocol during bolus administration of intravenous contrast. RADIATION DOSE REDUCTION: This exam was performed according to the departmental dose-optimization program which includes automated exposure control, adjustment of  the mA and/or kV according to patient size and/or use of iterative reconstruction technique. CONTRAST:  60mL OMNIPAQUE  IOHEXOL  350 MG/ML SOLN COMPARISON:  Pelvis and chest radiograph 03/25/2024 FINDINGS: CT CHEST FINDINGS Cardiovascular: Normal caliber thoracic aorta. Aortic and coronary artery atherosclerotic calcification. No pericardial effusion. Mediastinum/Nodes: No enlarged mediastinal, hilar, or axillary lymph nodes. Thyroid  gland, trachea, and esophagus demonstrate no significant findings. Lungs/Pleura: Trace bilateral pleural effusions. Posterior lower lobe atelectasis or pneumonia. Bronchial wall thickening and occlusive debris throughout the left lower lobe bronchi. No pneumothorax. Musculoskeletal: Mildly displaced fractures of the right ninth and eighth ribs at the  costochondral junction. Mildly displaced fractures of the posterolateral right tenth and eleventh ribs. Nondisplaced fracture of the posterior right twelfth rib. CT ABDOMEN PELVIS FINDINGS Hepatobiliary: No focal liver abnormality is seen. No gallstones, gallbladder wall thickening, or biliary dilatation. Pancreas: Unremarkable. Spleen: Unremarkable. Adrenals/Urinary Tract: Normal adrenal glands. No urinary calculi or hydronephrosis. Mild bladder wall thickening of the nondistended bladder. Stomach/Bowel: Normal caliber large and small bowel. Colonic diverticulosis without diverticulitis. Stomach is within normal limits. No bowel wall thickening. Normal appendix. Moderate stool in the rectum. Vascular/Lymphatic: Aortic atherosclerosis. No enlarged abdominal or pelvic lymph nodes. Reproductive: Enlarged prostate indenting the floor of the bladder. Other: No free intraperitoneal fluid or air. Musculoskeletal: No acute fracture or destructive osseous lesion. IMPRESSION: 1. Trace bilateral pleural effusions. Posterior lower lobe atelectasis or pneumonia. Bronchial wall thickening and occlusive debris throughout the left lower lobe bronchi favors aspiration. 2. Mildly displaced fractures of the right eighth through twelfth ribs. No evidence of underlying pathologic lesion. 3. Enlarged prostate indenting the floor of the bladder. Mild bladder wall thickening may be due to chronic outlet obstruction. 4. Aortic Atherosclerosis (ICD10-I70.0). Electronically Signed   By: Norman Gatlin M.D.   On: 03/26/2024 23:01   MR Brain W and Wo Contrast Result Date: 03/25/2024 CLINICAL DATA:  Cerebral edema on CT, recent fall. EXAM: MRI HEAD WITHOUT AND WITH CONTRAST TECHNIQUE: Multiplanar, multiecho pulse sequences of the brain and surrounding structures were obtained without and with intravenous contrast. CONTRAST:  7mL GADAVIST  GADOBUTROL  1 MMOL/ML IV SOLN COMPARISON:  Earlier same day head CT. FINDINGS: Brain: 1.1 x 1.0 x 1.3 cm  nodular enhancement in the left basal ganglia involving the caudate nucleus with surrounding edema throughout the basal ganglia extending into the left temporal white matter. There is an additional focus of nodular enhancement in the left external capsule measures 1.2 x 1.0 x 1.5 cm. There is additional gyral enhancement within the anterior and medial left temporal lobe, irregular and gyral enhancement in the inferolateral left frontal lobe, and gyral enhancement along the inferior aspect of the left frontal lobe. There is edema throughout the left temporal lobe with associated mass effect on the left temporal horn. Additional edema noted in the ventral aspect of both thalami. No significant midline shift. Enhancement also noted extending into the left hypothalamus extending into the tuber cinereum with small focus of enhancement extending into the right hypothalamus observed on coronal series 15 image 14. No acute infarct. No susceptibility weighted images due to patient discomfort. T2/FLAIR hyperintensity in the periventricular and subcortical white matter likely reflecting chronic microvascular ischemic changes. Posterior fossa is unremarkable. The basilar cisterns are patent. No extra-axial fluid collections. Ventricles: Prominence of the lateral ventricles suggestive of underlying parenchymal volume loss. Partial effacement of the left temporal horn. Vascular: Skull base flow voids are visualized. Skull and upper cervical spine: No focal abnormality. Sinuses/Orbits: Orbits are symmetric. Mucosal thickening in the  left ethmoid sinus. Other: Small left mastoid effusion. IMPRESSION: Nodular enhancement in the left basal ganglia and left external capsule as well as areas of gyral enhancement in the anterior left temporal lobe and inferior left frontal lobe. Additional enhancement extending into the left hypothalamus involving the tuber cinereum with small focus of enhancement extending into the right hypothalamus.  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 Additional considerations include metastatic disease, limbic/autoimmune encephalitis, herpes encephalitis among other etiologies. Recommend neurosurgical consultation and correlation with lumbar puncture. Edema throughout the left temporal lobe extending into the external capsule and basal ganglia with associated mass effect and partial effacement of the left temporal horn. No midline shift. No evidence of infarct. Mild chronic microvascular ischemic changes and parenchymal volume loss. Electronically Signed   By: Donnice Mania M.D.   On: 03/25/2024 18:56   MR Cervical Spine Wo Contrast Result Date: 03/25/2024 CLINICAL DATA:  Cervical spine fracture EXAM: MRI CERVICAL SPINE WITHOUT CONTRAST TECHNIQUE: Multiplanar, multisequence MR imaging of the cervical spine was performed. No intravenous contrast was administered. COMPARISON:  03/25/2024 CT scan FINDINGS: Alignment: No vertebral subluxation is observed. Vertebrae: Acute fracture of the left posterior margin of the C5 spinous process with associated marrow edema, the fractures shown on image 17 series 12. Acute fracture of the spinous process of C6 centered about 1 cm from the posterior tip as shown on 22 series 12, with associated local marrow edema. Edema tracks along the adjacent interspinous ligaments especially at C5-6. Edema also extends along the interspinous ligament at C6-7. Facet edema on the left at C3-4 is likely degenerative on image 15 series 11. Severe degenerative findings are present at this level. Fused left facet joint at C4-5. Mild degenerative endosteal edema along the right facet joint at C3-4. Type 1 degenerative endplate findings at C6-7. Disc desiccation throughout the cervical spine with loss of disc height most notable at C5-6 and C6-7. Cord: No significant abnormal spinal cord signal is observed. Posterior Fossa, vertebral arteries, paraspinal tissues: As noted above, there is edema along  the interspinous ligaments at C5-6 and C6-7 potentially reflecting ligamentous injury. There is mild but abnormal edema tracking along the anterior longitudinal ligament extending from about C3 through C6. Disc levels: C2-3: No impingement.  Right uncinate and facet spurring noted. C3-4: Moderate to prominent bilateral foraminal stenosis due to disc bulge, uncinate spurring, and facet arthropathy. C4-5: No impingement. Fused and somewhat degenerated left facet joint. C5-6: Mild to moderate left foraminal stenosis due to uncinate spurring and mild facet arthropathy. C6-7: Moderate bilateral foraminal stenosis and mild central narrowing of the thecal sac due to disc bulge, uncinate spurring, and facet arthropathy. C7-T1: Unremarkable IMPRESSION: 1. Acute fractures of the spinous processes of C5 and C6, with associated marrow edema. 2. Edema tracks along the interspinous ligaments at C5-6 and C6-7, potentially reflecting ligamentous injury. 3. Mild but abnormal edema tracking along the anterior longitudinal ligament extending from about C3 through C6, potentially reflecting ligamentous injury. 4. Cervical spondylosis and degenerative disc disease, causing moderate to prominent impingement at C3-4; moderate impingement at C6-7; and mild to moderate impingement at C5-6. Electronically Signed   By: Ryan Salvage M.D.   On: 03/25/2024 18:41   CT HEAD WO CONTRAST Result Date: 03/25/2024 CLINICAL DATA:  Head trauma, multiple falls, weakness while walking up stairs. EXAM: CT HEAD WITHOUT CONTRAST CT CERVICAL SPINE WITHOUT CONTRAST TECHNIQUE: Multidetector CT imaging of the head and cervical spine was performed following the standard protocol without intravenous contrast. Multiplanar CT image  reconstructions of the cervical spine were also generated. RADIATION DOSE REDUCTION: This exam was performed according to the departmental dose-optimization program which includes automated exposure control, adjustment of the mA  and/or kV according to patient size and/or use of iterative reconstruction technique. COMPARISON:  MRI head 09/23/2021. FINDINGS: CT HEAD FINDINGS Brain: No acute intracranial hemorrhage. Hypoattenuation in the left temporal lobe without evidence of volume loss concerning for vasogenic edema. No significant midline shift. Mild parenchymal volume loss. The basilar cisterns are patent. Ventricles: There is mild mass effect on the left temporal horn. No hydrocephalus. Vascular: No hyperdense vessel or unexpected calcification. Skull: No acute or aggressive finding. Orbits: Orbits are symmetric. Sinuses: Mucosal thickening in the ethmoid sinuses. Other: Trace fluid in the left mastoid tip. CT CERVICAL SPINE FINDINGS Alignment: Straightening of the normal cervical lordosis. No significant listhesis. No facet subluxation or dislocation. Skull base and vertebrae: Mildly displaced and angulated fracture of C6 spinous process. There is an additional fracture of the C4 spinous process with well corticated margins. No compression fracture. No suspicious osseous lesion. Soft tissues and spinal canal: No prevertebral fluid or swelling. No visible canal hematoma. Disc levels: Moderate disc space narrowing at C5-6 and C6-7. Disc osteophyte complexes at multiple levels. No high-grade osseous spinal canal stenosis. Facet arthrosis at multiple levels foraminal narrowing is most pronounced at C3-4 and on the right at C6-7. Upper chest: Negative. Other: None. IMPRESSION: Vasogenic edema in the left temporal lobe with mild mass effect on the left temporal horn. Recommend MRI head with without contrast for further evaluation. No midline shift. Displaced and slightly angulated fracture of the C6 spinous process and additional more chronic appearing fracture of the C4 spinous process. Recommend correlation with tenderness at these levels and consider MRI cervical spine for further evaluation. Chronic and degenerative changes as above.  Electronically Signed   By: Donnice Mania M.D.   On: 03/25/2024 16:21   CT CERVICAL SPINE WO CONTRAST Result Date: 03/25/2024 CLINICAL DATA:  Head trauma, multiple falls, weakness while walking up stairs. EXAM: CT HEAD WITHOUT CONTRAST CT CERVICAL SPINE WITHOUT CONTRAST TECHNIQUE: Multidetector CT imaging of the head and cervical spine was performed following the standard protocol without intravenous contrast. Multiplanar CT image reconstructions of the cervical spine were also generated. RADIATION DOSE REDUCTION: This exam was performed according to the departmental dose-optimization program which includes automated exposure control, adjustment of the mA and/or kV according to patient size and/or use of iterative reconstruction technique. COMPARISON:  MRI head 09/23/2021. FINDINGS: CT HEAD FINDINGS Brain: No acute intracranial hemorrhage. Hypoattenuation in the left temporal lobe without evidence of volume loss concerning for vasogenic edema. No significant midline shift. Mild parenchymal volume loss. The basilar cisterns are patent. Ventricles: There is mild mass effect on the left temporal horn. No hydrocephalus. Vascular: No hyperdense vessel or unexpected calcification. Skull: No acute or aggressive finding. Orbits: Orbits are symmetric. Sinuses: Mucosal thickening in the ethmoid sinuses. Other: Trace fluid in the left mastoid tip. CT CERVICAL SPINE FINDINGS Alignment: Straightening of the normal cervical lordosis. No significant listhesis. No facet subluxation or dislocation. Skull base and vertebrae: Mildly displaced and angulated fracture of C6 spinous process. There is an additional fracture of the C4 spinous process with well corticated margins. No compression fracture. No suspicious osseous lesion. Soft tissues and spinal canal: No prevertebral fluid or swelling. No visible canal hematoma. Disc levels: Moderate disc space narrowing at C5-6 and C6-7. Disc osteophyte complexes at multiple levels. No  high-grade osseous spinal canal  stenosis. Facet arthrosis at multiple levels foraminal narrowing is most pronounced at C3-4 and on the right at C6-7. Upper chest: Negative. Other: None. IMPRESSION: Vasogenic edema in the left temporal lobe with mild mass effect on the left temporal horn. Recommend MRI head with without contrast for further evaluation. No midline shift. Displaced and slightly angulated fracture of the C6 spinous process and additional more chronic appearing fracture of the C4 spinous process. Recommend correlation with tenderness at these levels and consider MRI cervical spine for further evaluation. Chronic and degenerative changes as above. Electronically Signed   By: Donnice Mania M.D.   On: 03/25/2024 16:21   DG Chest Port 1 View Result Date: 03/25/2024 CLINICAL DATA:  weak EXAM: PORTABLE CHEST - 1 VIEW COMPARISON:  Mar 15, 2021 FINDINGS: No focal airspace consolidation or pleural effusion. No cardiomegaly. Tortuous aorta with aortic atherosclerosis. Multilevel thoracic osteophytosis. Apparent lucency overlying the lateral left seventh rib. IMPRESSION: 1. Apparent lucency overlying the lateral left seventh rib, likely artifactual from overlapping ribs and material external to the patient. No displaced rib fractures or visualized pneumothoraces. 2. No pneumonia or pulmonary edema. Electronically Signed   By: Rogelia Myers M.D.   On: 03/25/2024 16:03   DG Pelvis Portable Result Date: 03/25/2024 CLINICAL DATA:  Fall.  Weakness. EXAM: PORTABLE PELVIS 1-2 VIEWS COMPARISON:  None CT pelvis 03/07/2006 FINDINGS: Mild bilateral sacroiliac subchondral sclerosis. Moderate bilateral superior femoroacetabular joint space narrowing. Small chronic ossicles at the superolateral aspect of each femoroacetabular joint. The pubic symphysis joint space is maintained. No acute fracture is seen. No dislocation. IMPRESSION: 1. Moderate bilateral femoroacetabular osteoarthritis. 2. No acute fracture is seen.  Electronically Signed   By: Tanda Lyons M.D.   On: 03/25/2024 16:03     Subjective: - no chest pain, shortness of breath, no abdominal pain, nausea or vomiting.   Discharge Exam: BP (!) 153/79 (BP Location: Right Arm)   Pulse (!) 54   Temp 98.1 F (36.7 C) (Oral)   Resp 16   Ht 5' 8 (1.727 m)   Wt 69 kg   SpO2 100%   BMI 23.13 kg/m   General: Pt is alert, awake, not in acute distress Cardiovascular: RRR, S1/S2 +, no rubs, no gallops Respiratory: CTA bilaterally, no wheezing, no rhonchi Abdominal: Soft, NT, ND, bowel sounds + Extremities: no edema, no cyanosis    The results of significant diagnostics from this hospitalization (including imaging, microbiology, ancillary and laboratory) are listed below for reference.     Microbiology: Recent Results (from the past 240 hours)  CSF culture w Gram Stain     Status: None   Collection Time: 03/25/24 11:35 PM   Specimen: CSF; Cerebrospinal Fluid  Result Value Ref Range Status   Specimen Description   Final    CSF Performed at [facility], [address] [place], [place] [address]    Special Requests   Final    NONE Performed at [facility], [address] [place], [place] [address]    Gram Stain   Final    CYTOSPIN SMEAR WBC PRESENT, PREDOMINANTLY MONONUCLEAR NO ORGANISMS SEEN    Culture   Final    NO GROWTH 3 DAYS Performed at [facility], [address], [place], [place] [address]    Report Status 03/30/2024 FINAL  Final     Labs: Basic Metabolic Panel: Recent Labs  Lab 03/28/24 1114 03/30/24 0649 04/01/24 0427 04/03/24 0422  NA 135 138 139 139  K 4.1 3.6 3.5  3.8  CL 99 99 104 101  CO2 26 27 28 29   GLUCOSE 99 94 90 96  BUN 7* 6* 9 11  CREATININE 0.73 0.63 0.76 0.80  CALCIUM 8.4* 8.8* 8.5* 8.6*   Liver Function Tests: Recent Labs  Lab 03/27/24 1706  ALBUMIN 3.3*   CBC: Recent Labs  Lab 03/28/24 1114  WBC 8.5  HGB 12.7*  HCT 37.8*  MCV 86.9  PLT 322   CBG: Recent Labs   Lab 04/02/24 0614 04/02/24 1214 04/02/24 1740 04/03/24 0022 04/03/24 0609  GLUCAP 75 76 73 97 87   Hgb A1c No results for input(s): HGBA1C in the last 72 hours. Lipid Profile No results for input(s): CHOL, HDL, LDLCALC, TRIG, CHOLHDL, LDLDIRECT in the last 72 hours. Thyroid  function studies No results for input(s): TSH, T4TOTAL, T3FREE, THYROIDAB in the last 72 hours.  Invalid input(s): FREET3 Urinalysis    Component Value Date/Time   COLORURINE YELLOW 03/25/2024 1618   APPEARANCEUR CLEAR 03/25/2024 1618   LABSPEC 1.021 03/25/2024 1618   PHURINE 6.0 03/25/2024 1618   GLUCOSEU NEGATIVE 03/25/2024 1618   HGBUR SMALL (A) 03/25/2024 1618   BILIRUBINUR NEGATIVE 03/25/2024 1618   KETONESUR NEGATIVE 03/25/2024 1618   PROTEINUR NEGATIVE 03/25/2024 1618   NITRITE NEGATIVE 03/25/2024 1618   LEUKOCYTESUR NEGATIVE 03/25/2024 1618    FURTHER DISCHARGE INSTRUCTIONS:   Get Medicines reviewed and adjusted: Please take all your medications with you for your next visit with your Primary MD   Laboratory/radiological data: Please request your Primary MD to go over all hospital tests and procedure/radiological results at the follow up, please ask your Primary MD to get all Hospital records sent to his/her office.   In some cases, they will be blood work, cultures and biopsy results pending at the time of your discharge. Please request that your primary care M.D. goes through all the records of your hospital data and follows up on these results.   Also Note the following: If you experience worsening of your admission symptoms, develop shortness of breath, life threatening emergency, suicidal or homicidal thoughts you must seek medical attention immediately by calling 911 or calling your MD immediately  if symptoms less severe.   You must read complete instructions/literature along with all the possible adverse reactions/side effects for all the Medicines you take and  that have been prescribed to you. Take any new Medicines after you have completely understood and accpet all the possible adverse reactions/side effects.    Do not drive when taking Pain medications or sleeping medications (Benzodaizepines)   Do not take more than prescribed Pain, Sleep and Anxiety Medications. It is not advisable to combine anxiety,sleep and pain medications without talking with your primary care practitioner   Special Instructions: If you have smoked or chewed Tobacco  in the last 2 yrs please stop smoking, stop any regular Alcohol  and or any Recreational drug use.   Wear Seat belts while driving.   Please note: You were cared for by a hospitalist during your hospital stay. Once you are discharged, your primary care physician will handle any further medical issues. Please note that NO REFILLS for any discharge medications will be authorized once you are discharged, as it is imperative that you return to your primary care physician (or establish a relationship with a primary care physician if you do not have one) for your post hospital discharge needs so that they can reassess your need for medications and monitor your lab values.  Time coordinating discharge:  35 minutes  SIGNED:  Nilda Fendt, MD, PhD 04/03/2024, 9:58 AM

## 2024-04-02 NOTE — Progress Notes (Signed)
 PROGRESS NOTE  Steven Gibbs ZHY:[id number] DOB: 03/29/1946 DOA: 03/25/2024 PCP: Artemisa Bile, MD   LOS: 8 days   Brief Narrative / Interim history: 78 year old with past medical history significant for Parkinson dementia, hypertension present weakness and fall.  Patient fell on the floor able to get him on the slip on the floor.  Next day he was complaining of pain.  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  Lumbar puncture performed by EDP on 6/10 with no concerning findings.  Transferred to [facility] for [facility] evaluation. At baseline patient is sometimes able to recognize his wife, sometimes will answer simple questions, over the past 6 weeks patient had decline and has had multiple falls. Plan to discharge with hospice, to SNF, long term care.   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  He was admitted with frequent falls and mental status has been declining, has been sleepier and more confused.  Underwent an MRI of the brain which showed nodular enhancement of the left basal ganglia, left external capsule, area of gyral enhancement anterior left temporal and inferior frontal lobe, left hypothalamus.  Neurosurgery and neurology evaluated, this lesions are not amenable to surgical resection.  An EEG showed generalized slowing.  An LP was negative for meningitis, encephalitis panel was negative.  CT of the chest abdomen pelvis showed trace bilateral pleural effusion, posterior lower lobe atelectasis or PNA, Bronchia wall thickening.  Enlarged prostate indenting the floor  of the bladder. Mild bladder wall thickening may be due to chronic outlet obstruction.  Given lack of reversible findings, underlying dementia, palliative care consulted and plans are in place for patient to go home with hospice.  Active problems  Aspiration PNA -has completed 7 days of antibiotics while hospitalized Fracture of the cervical spinous process - Neurosurgery recommend C collar.  Essential hypertension - Hold Cardizem , metoprolol  dose was lowered due to bradycardia, tolerating it well Dementia, Parkinson's disease - Continue with Aricept , Lexapro , Remeron .  Mildly displaced fractures of the right eighth through twelfth ribs. No evidence of underlying pathologic lesion -Incentive spirometry  Scheduled Meds:  acetaminophen  (TYLENOL ) oral liquid 160 mg/5 mL  650 mg Oral TID   donepezil   10 mg Oral Daily   escitalopram   5 mg Oral Daily   feeding supplement  237 mL Oral BID BM   metoprolol  succinate  25 mg Oral Daily   mirtazapine   15 mg Oral QHS   Continuous Infusions:  ampicillin -sulbactam (UNASYN ) IV 3 g (04/02/24 0604)   PRN Meds:.acetaminophen  **OR** acetaminophen , morphine  CONCENTRATE, ondansetron  **OR** ondansetron  (ZOFRAN ) IV, polyethylene glycol  Current Outpatient Medications  Medication Instructions   atorvastatin (LIPITOR) 10 mg, Daily   Cyanocobalamin  (B-12 PO) 1,000 mcg, Daily   diltiazem  (DILACOR XR ) 180 mg, Daily   donepezil  (ARICEPT ) 10 mg, Oral, Daily   escitalopram  (LEXAPRO ) 5 mg, Daily   metoprolol  succinate (TOPROL -XL) 50 mg, Daily   mirtazapine  (REMERON ) 15 mg, Daily at bedtime   thiamine (VITAMIN B-1) 100 mg, Daily    Diet Orders (From admission, onward)     Start     Ordered   03/30/24 1436  DIET -  DYS 1 Room service appropriate? No; Fluid consistency: Thin  Diet effective now       Comments: Comfort feeds with allowance of additional consistencies as pt/family desires for comfort.  Question Answer Comment  Room service appropriate? No   Fluid  consistency: Thin      03/30/24 1436            DVT prophylaxis: SCDs Start: 03/25/24 2339   Lab Results  Component Value Date   PLT 322 03/28/2024      Code Status: Limited: Do not attempt resuscitation (DNR) -DNR-LIMITED -Do Not Intubate/DNI   Family Communication: No family at bedside  Status is: Inpatient Remains inpatient appropriate because: Severity of illness   Level of care: Telemetry Medical  Consultants:  Neurology Neurosurgery Palliative care  Objective: Vitals:   04/01/24 1938 04/01/24 2332 04/02/24 0351 04/02/24 0835  BP: 137/73 (!) 159/84 137/73 (!) 149/71  Pulse: (!) 58 (!) 58 63 60  Resp: 14 18 18 18   Temp: (!) 97.5 F (36.4 C) 98.2 F (36.8 C) 98 F (36.7 C) 98.4 F (36.9 C)  TempSrc: Oral Oral Oral Oral  SpO2: 100% 100%  100%  Weight:      Height:        Intake/Output Summary (Last 24 hours) at 04/02/2024 1109 Last data filed at 04/02/2024 0835 Gross per 24 hour  Intake 280 ml  Output 400 ml  Net -120 ml   Wt Readings from Last 3 Encounters:  03/25/24 69 kg  11/09/21 69.5 kg  07/11/12 81.6 kg    Examination:  Constitutional: NAD Eyes: no scleral icterus ENMT: Mucous membranes are moist.  Neck: normal, supple Respiratory: clear to auscultation bilaterally, no wheezing, no crackles. Normal respiratory effort. No accessory muscle use.  Cardiovascular: Regular rate and rhythm, no murmurs / rubs / gallops. No LE edema.  Abdomen: non distended, no tenderness. Bowel sounds positive.  Musculoskeletal: no clubbing / cyanosis.   Data Reviewed: I have independently reviewed following labs and imaging studies   CBC Recent Labs  Lab 03/27/24 0553 03/28/24 1114  WBC 9.7 8.5  HGB 14.0 12.7*  HCT 41.5 37.8*  PLT 336 322  MCV 87.7 86.9  MCH 29.6 29.2  MCHC 33.7 33.6  RDW 11.7 11.7  LYMPHSABS 1.0  --   MONOABS 0.7  --   EOSABS 0.1  --   BASOSABS 0.0  --     Recent Labs  Lab 03/27/24 0553 03/27/24 1706 03/28/24 1114  03/30/24 0649 04/01/24 0427  NA 135  --  135 138 139  K 3.7  --  4.1 3.6 3.5  CL 98  --  99 99 104  CO2 29  --  26 27 28   GLUCOSE 96  --  99 94 90  BUN 9  --  7* 6* 9  CREATININE 0.74  --  0.73 0.63 0.76  CALCIUM 8.8*  --  8.4* 8.8* 8.5*  AST 30  --   --   --   --   ALT 16  --   --   --   --   ALKPHOS 93  --   --   --   --   BILITOT 1.0  --   --   --   --   ALBUMIN 2.8* 3.3*  --   --   --   MG 2.0  --   --   --   --     ------------------------------------------------------------------------------------------------------------------ No results for input(s): CHOL, HDL, LDLCALC,  TRIG, CHOLHDL, LDLDIRECT in the last 72 hours.  No results found for: HGBA1C ------------------------------------------------------------------------------------------------------------------ No results for input(s): TSH, T4TOTAL, T3FREE, THYROIDAB in the last 72 hours.  Invalid input(s): FREET3  Cardiac Enzymes No results for input(s): CKMB, TROPONINI, MYOGLOBIN in the last 168 hours.  Invalid input(s): CK ------------------------------------------------------------------------------------------------------------------ No results found for: BNP  CBG: Recent Labs  Lab 04/01/24 0611 04/01/24 1232 04/01/24 1803 04/01/24 2333 04/02/24 0614  GLUCAP 74 84 67* 72 75    Recent Results (from the past 240 hours)  CSF culture w Gram Stain     Status: None   Collection Time: 03/25/24 11:35 PM   Specimen: CSF; Cerebrospinal Fluid  Result Value Ref Range Status   Specimen Description   Final    CSF Performed at [facility], [address] [place], [place] [address]    Special Requests   Final    NONE Performed at [facility], [address] [place], [place] [address]    Gram Stain   Final    CYTOSPIN SMEAR WBC PRESENT, PREDOMINANTLY MONONUCLEAR NO ORGANISMS SEEN    Culture   Final    NO GROWTH 3 DAYS Performed at [facility], [address]  [place], [place] [address]    Report Status 03/30/2024 FINAL  Final     Radiology Studies: No results found.   Kathlen Para, MD, PhD [facility]  Between 7 am - 7 pm I am available, please contact me via Amion (for emergencies) or Securechat (non urgent messages)  Between 7 pm - 7 am I am not available, please contact night coverage MD/APP via Amion

## 2024-04-02 NOTE — Plan of Care (Signed)
  Problem: Education: Goal: Knowledge of General Education information will improve Description: Including pain rating scale, medication(s)/side effects and non-pharmacologic comfort measures Outcome: Progressing   Problem: Activity: Goal: Risk for activity intolerance will decrease Outcome: Progressing   Problem: Coping: Goal: Level of anxiety will decrease Outcome: Progressing   Problem: Elimination: Goal: Will not experience complications related to bowel motility Outcome: Progressing Goal: Will not experience complications related to urinary retention Outcome: Progressing   Problem: Pain Managment: Goal: General experience of comfort will improve and/or be controlled Outcome: Progressing   Problem: Safety: Goal: Ability to remain free from injury will improve Outcome: Progressing   Problem: Skin Integrity: Goal: Risk for impaired skin integrity will decrease Outcome: Progressing

## 2024-04-03 DIAGNOSIS — G939 Disorder of brain, unspecified: Secondary | ICD-10-CM | POA: Diagnosis not present

## 2024-04-03 LAB — BASIC METABOLIC PANEL WITH GFR
Anion gap: 9 (ref 5–15)
BUN: 11 mg/dL (ref 8–23)
CO2: 29 mmol/L (ref 22–32)
Calcium: 8.6 mg/dL — ABNORMAL LOW (ref 8.9–10.3)
Chloride: 101 mmol/L (ref 98–111)
Creatinine, Ser: 0.8 mg/dL (ref 0.61–1.24)
GFR, Estimated: >60 mL/min (ref >60)
Glucose, Bld: 96 mg/dL (ref 70–99)
Potassium: 3.8 mmol/L (ref 3.5–5.1)
Sodium: 139 mmol/L (ref 135–145)

## 2024-04-03 LAB — GLUCOSE, CAPILLARY
Glucose-Capillary: 87 mg/dL (ref 70–99)
Glucose-Capillary: 97 mg/dL (ref 70–99)

## 2024-04-03 NOTE — TOC Transition Note (Signed)
 Transition of Care [facility]) - Discharge Note   Patient Details  Name: Steven Gibbs MRN: [id number] Date of Birth: Oct 26, 1945  Transition of Care [facility] CM/SW Contact:  Jonathan Neighbor, RN Phone Number: 04/03/2024, 10:13 AM   Clinical Narrative:     Pt is discharging home with [facility]. He will transport home via [facility]. CM has verified the home address with patients spouse. [facility] aware of dc and timing for a home visit today.    Final next level of care: Home w Hospice Care Barriers to Discharge: No Barriers Identified   Patient Goals and CMS Choice Patient states their goals for this hospitalization and ongoing recovery are:: patient unable to participate in goal setting, not oriented CMS Medicare.gov Compare Post Acute Care list provided to:: Patient Represenative (must comment) Choice offered to / list presented to : Spouse [place] ownership interest in [facility].provided to:: Spouse    Discharge Placement                       Discharge Plan and Services Additional resources added to the After Visit Summary for       Post Acute Care Choice: Skilled Nursing Facility                               Social Drivers of Health (SDOH) Interventions SDOH Screenings   Transportation Needs: Patient Unable To Answer (03/27/2024)  Utilities: Patient Unable To Answer (03/27/2024)  Tobacco Use: Medium Risk (03/25/2024)     Readmission Risk Interventions     No data to display

## 2024-04-03 NOTE — Progress Notes (Signed)
 Called wife to go over discharge instructions. She told me that she was not at home and she would call me back as soon as possible.

## 2024-04-03 NOTE — Plan of Care (Signed)
  Problem: Clinical Measurements: Goal: Respiratory complications will improve Outcome: Progressing   Problem: Education: Goal: Knowledge of General Education information will improve Description: Including pain rating scale, medication(s)/side effects and non-pharmacologic comfort measures Outcome: Not Progressing   Problem: Clinical Measurements: Goal: Will remain free from infection Outcome: Not Progressing   Problem: Activity: Goal: Risk for activity intolerance will decrease Outcome: Not Progressing   Problem: Nutrition: Goal: Adequate nutrition will be maintained Outcome: Not Progressing

## 2024-04-03 NOTE — Progress Notes (Signed)
 Discharge instructions including medications discussed with wife and answered all questions. She was very appreciative of all the care he has received.

## 2024-04-03 NOTE — Plan of Care (Signed)
  Problem: Education: Goal: Knowledge of General Education information will improve Description: Including pain rating scale, medication(s)/side effects and non-pharmacologic comfort measures Outcome: Progressing   Problem: Health Behavior/Discharge Planning: Goal: Ability to manage health-related needs will improve Outcome: Progressing   Problem: Clinical Measurements: Goal: Ability to maintain clinical measurements within normal limits will improve Outcome: Progressing Goal: Will remain free from infection Outcome: Progressing Goal: Diagnostic test results will improve Outcome: Progressing Goal: Respiratory complications will improve Outcome: Progressing Goal: Cardiovascular complication will be avoided Outcome: Progressing   Problem: Nutrition: Goal: Adequate nutrition will be maintained Outcome: Progressing   Problem: Coping: Goal: Level of anxiety will decrease Outcome: Progressing   Problem: Elimination: Goal: Will not experience complications related to bowel motility Outcome: Progressing Goal: Will not experience complications related to urinary retention Outcome: Progressing   Problem: Pain Managment: Goal: General experience of comfort will improve and/or be controlled Outcome: Progressing   Problem: Safety: Goal: Ability to remain free from injury will improve Outcome: Progressing

## 2024-04-03 NOTE — Progress Notes (Signed)
 Patient discharged via [facility] with all personal belongings found in room, consisting of one pair of pants, one shirt, and a pair of underwear. Patient provided with pain medication prior to transport.

## 2024-04-04 ENCOUNTER — Encounter: Payer: Self-pay | Admitting: Surgery

## 2024-04-08 DIAGNOSIS — S2241XA Multiple fractures of ribs, right side, initial encounter for closed fracture: Secondary | ICD-10-CM | POA: Diagnosis not present

## 2024-04-08 DIAGNOSIS — S12500A Unspecified displaced fracture of sixth cervical vertebra, initial encounter for closed fracture: Secondary | ICD-10-CM | POA: Diagnosis not present

## 2024-04-08 DIAGNOSIS — S12300A Unspecified displaced fracture of fourth cervical vertebra, initial encounter for closed fracture: Secondary | ICD-10-CM | POA: Diagnosis not present

## 2024-04-08 DIAGNOSIS — J690 Pneumonitis due to inhalation of food and vomit: Secondary | ICD-10-CM | POA: Diagnosis not present

## 2024-04-08 DIAGNOSIS — C719 Malignant neoplasm of brain, unspecified: Secondary | ICD-10-CM | POA: Diagnosis not present

## 2024-04-08 DIAGNOSIS — F0390 Unspecified dementia without behavioral disturbance: Secondary | ICD-10-CM | POA: Diagnosis not present

## 2024-05-16 DEATH — deceased
